# Patient Record
Sex: Female | Born: 1965 | Race: White | Hispanic: No | Smoking: Current every day smoker
Health system: Southern US, Community
[De-identification: ages and names within clinical notes are randomized; demographics above are authoritative.]

## PROBLEM LIST (undated history)

## (undated) DIAGNOSIS — F329 Major depressive disorder, single episode, unspecified: Secondary | ICD-10-CM

## (undated) DIAGNOSIS — F419 Anxiety disorder, unspecified: Secondary | ICD-10-CM

## (undated) DIAGNOSIS — M549 Dorsalgia, unspecified: Secondary | ICD-10-CM

## (undated) DIAGNOSIS — F32A Depression, unspecified: Secondary | ICD-10-CM

## (undated) HISTORY — DX: Dorsalgia, unspecified: M54.9

## (undated) HISTORY — DX: Major depressive disorder, single episode, unspecified: F32.9

## (undated) HISTORY — DX: Depression, unspecified: F32.A

## (undated) HISTORY — DX: Anxiety disorder, unspecified: F41.9

## (undated) HISTORY — PX: TUBAL LIGATION: SHX77

---

## 2002-09-13 ENCOUNTER — Emergency Department (HOSPITAL_COMMUNITY): Admission: EM | Admit: 2002-09-13 | Discharge: 2002-09-13 | Payer: Self-pay | Admitting: Emergency Medicine

## 2003-03-23 ENCOUNTER — Ambulatory Visit (HOSPITAL_COMMUNITY): Admission: RE | Admit: 2003-03-23 | Discharge: 2003-03-23 | Payer: Self-pay | Admitting: Obstetrics and Gynecology

## 2003-03-23 ENCOUNTER — Encounter (INDEPENDENT_AMBULATORY_CARE_PROVIDER_SITE_OTHER): Payer: Self-pay | Admitting: Specialist

## 2003-04-11 ENCOUNTER — Emergency Department (HOSPITAL_COMMUNITY): Admission: EM | Admit: 2003-04-11 | Discharge: 2003-04-12 | Payer: Self-pay | Admitting: Emergency Medicine

## 2003-04-12 ENCOUNTER — Encounter: Payer: Self-pay | Admitting: Emergency Medicine

## 2003-05-08 ENCOUNTER — Encounter: Payer: Self-pay | Admitting: Family Medicine

## 2003-05-08 ENCOUNTER — Encounter: Admission: RE | Admit: 2003-05-08 | Discharge: 2003-05-08 | Payer: Self-pay | Admitting: Family Medicine

## 2004-03-06 ENCOUNTER — Other Ambulatory Visit: Admission: RE | Admit: 2004-03-06 | Discharge: 2004-03-06 | Payer: Self-pay | Admitting: Family Medicine

## 2004-03-08 ENCOUNTER — Other Ambulatory Visit: Admission: RE | Admit: 2004-03-08 | Discharge: 2004-03-08 | Payer: Self-pay | Admitting: Family Medicine

## 2005-08-08 ENCOUNTER — Emergency Department (HOSPITAL_COMMUNITY): Admission: EM | Admit: 2005-08-08 | Discharge: 2005-08-08 | Payer: Self-pay | Admitting: Emergency Medicine

## 2005-08-26 ENCOUNTER — Emergency Department (HOSPITAL_COMMUNITY): Admission: EM | Admit: 2005-08-26 | Discharge: 2005-08-26 | Payer: Self-pay | Admitting: Emergency Medicine

## 2005-10-14 ENCOUNTER — Emergency Department (HOSPITAL_COMMUNITY): Admission: EM | Admit: 2005-10-14 | Discharge: 2005-10-14 | Payer: Self-pay | Admitting: Emergency Medicine

## 2006-12-11 ENCOUNTER — Emergency Department (HOSPITAL_COMMUNITY): Admission: EM | Admit: 2006-12-11 | Discharge: 2006-12-11 | Payer: Self-pay | Admitting: Emergency Medicine

## 2007-09-10 ENCOUNTER — Emergency Department (HOSPITAL_COMMUNITY): Admission: EM | Admit: 2007-09-10 | Discharge: 2007-09-10 | Payer: Self-pay | Admitting: Family Medicine

## 2008-08-10 ENCOUNTER — Encounter: Admission: RE | Admit: 2008-08-10 | Discharge: 2008-08-10 | Payer: Self-pay | Admitting: Family Medicine

## 2008-08-16 ENCOUNTER — Encounter
Admission: RE | Admit: 2008-08-16 | Discharge: 2008-08-17 | Payer: Self-pay | Admitting: Physical Medicine & Rehabilitation

## 2008-08-17 ENCOUNTER — Ambulatory Visit: Payer: Self-pay | Admitting: Physical Medicine & Rehabilitation

## 2009-05-31 ENCOUNTER — Emergency Department (HOSPITAL_COMMUNITY): Admission: EM | Admit: 2009-05-31 | Discharge: 2009-05-31 | Payer: Self-pay | Admitting: Emergency Medicine

## 2010-03-14 ENCOUNTER — Ambulatory Visit (HOSPITAL_COMMUNITY): Admission: RE | Admit: 2010-03-14 | Discharge: 2010-03-14 | Payer: Self-pay | Admitting: Obstetrics and Gynecology

## 2010-11-16 ENCOUNTER — Encounter: Payer: Self-pay | Admitting: Family Medicine

## 2010-11-17 ENCOUNTER — Encounter: Payer: Self-pay | Admitting: Family Medicine

## 2011-01-13 LAB — CBC
HCT: 35.3 % — ABNORMAL LOW (ref 36.0–46.0)
Hemoglobin: 11.9 g/dL — ABNORMAL LOW (ref 12.0–15.0)
MCHC: 33.8 g/dL (ref 30.0–36.0)
MCV: 85.7 fL (ref 78.0–100.0)
Platelets: 381 K/uL (ref 150–400)
RBC: 4.11 MIL/uL (ref 3.87–5.11)
RDW: 13.6 % (ref 11.5–15.5)
WBC: 8.1 K/uL (ref 4.0–10.5)

## 2011-01-13 LAB — HCG, SERUM, QUALITATIVE: Preg, Serum: NEGATIVE

## 2011-03-11 NOTE — Consult Note (Signed)
Consult requested by Dr Deniece Portela A. Sheffield Slider, MD   CHIEF COMPLAINT:  Back pain and posterior thigh pain.   Sydney Serrano is a 45 year old female with a 2-year history of low back  pain which radiates in the lower extremities mainly into the posterior  thigh area but occasionally down to the feet.  She states that her pain  averages at 8/10 level, described as sharp, constant, and aching.  Pain  interferes with activity at a 7/10 level.  She states that she had no  traumatic event immediately proceeding the onset of pain, although does  have a history of falling down on some ice a month or 2 prior to the  onset of her pain.  He has had no motor vehicle accident.  No Workers'  Compensation injury.   Pain is worse during the daytime and night hours, not too bad in the  morning.  Sleep is fair.  Pain is worse with bending, standing, and  improves with medication.   She has been on hydrocodone in the past and Dr. Jeannetta Nap prescribed it a  couple years for her per her report.  She did receive and fill a  prescription from a dentist and therefore, Dr. Jeannetta Nap did not fill  anymore prescriptions.   I did review the [facility] registry.  This does show Dr. Jeannetta Nap did  prescribe medications, starting December 14, 2006, both hydrocodone and  alprazolam.  She saw a PA at [facility] at  Dr. Spero Curb office, who prescribed some Lyrica.  The patient then had  some Tussionex cough syrup but really has had same providers.  The  dentist did prescribe medication including hydrocodone on June 07, 2007, but did receive some prescriptions from Dr. Jeannetta Nap after that as  well.   She denies any illicit drug abuse or taking any nondisclosed opiates at  this time.   SOCIAL HISTORY:  Divorced, lives with her son, smokes a pack per day.   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   REVIEW OF SYSTEMS:  Positive for depression, anxiety, has been on  anxiolytics as well  as antidepressant medications per Dr. Jeannetta Nap.  She  denies any suicidal thoughts.   Positive for fever or chills.  Positive for constipation, poor appetite.   FAMILY HISTORY:  Heart disease, diabetes, and high blood pressure.   PHYSICAL EXAMINATION:  VITAL SIGNS:  Blood pressure 143/80, pulse 76,  respiratory rate is 18, and O2 sat 100% on room air.  GENERAL:  Well-developed, well-nourished female in no acute distress.  Orientation x3.  NEURO:  Affect is alert.  Gait is normal.  She is able to toe-walk and  heel-walk.  She has normal coordination in upper and lower extremities.  She has no evidence of peripheral edema.  She has good peripheral  pulses.  Her deep tendon reflexes are 2-3+ biceps, triceps,  brachioradialis, and the ankle.  Sensation is normal in bilateral upper  and lower extremities.  Neck range of motion is normal.  Lumbar spine  range of motion is 75% forward flexion, 75% extension, end range  accompanied by pain.  She has some pain with left-sided bending.   Motor strength is 5/5 bilateral deltoid, biceps, triceps, grip, as well  as hip flexion, knee extension, and ankle dorsiflexion.  She has no  range of motion limitations or joint instability in the upper or lower  extremities.   REVIEW OF IMAGING STUDIES:  She has had  lumbar spine MRI performed at  [facility].  There is no evidence of lumbar stenosis.  Minimal  bulge at L4-L5. Tiny synovial cyst, right L4-L5.   IMPRESSION:  Chronic mainly axial low back pain but some lower extremity  radiating pattern, not clearly radicular.  Went over differential with  the patient including use of the spine model.  Given that at her pain  starts at around L4 area, I doubt that it is purely sacroiliac, consider  lumbar facet joints as pain generators.  Sacroiliitis is a little  further down the differential, also may be myofascial pain.   We will set her up with diagnostic facet injection.  We will give her   samples of Celebrex as an antiinflammatory.  Should continue these up to  the time of the injections if need be.   I checked a urine drug screen in the event we use narcotic analgesics as  part of her pain management program.  I did offer her tramadol until I  get urine drug screen results back, and she declines saying it does not  really help her nor does Darvocet.   In addition, she has some sleep disorder due to pain, some back spasms  that she notes.  We will start some Robaxin 500 mg nightly.   Thank you for this interesting consultation.  I will keep you apprised  of her situation.  Oswestry disability index score today is 32%, putting  her in the moderate range.      Erick Colace, M.D.  Electronically Signed     AEK/MedQ  D:08/17/2008 16:45:15  T:08/18/2008 03:05:40  Job #:  [id number]   cc:   Deniece Portela A. Sheffield Slider, M.D.   Buren Kos, M.D.  Fax: [phone]

## 2011-03-14 ENCOUNTER — Ambulatory Visit (INDEPENDENT_AMBULATORY_CARE_PROVIDER_SITE_OTHER): Payer: Self-pay | Admitting: Family Medicine

## 2011-03-14 ENCOUNTER — Encounter: Payer: Self-pay | Admitting: Family Medicine

## 2011-03-14 DIAGNOSIS — I10 Essential (primary) hypertension: Secondary | ICD-10-CM | POA: Insufficient documentation

## 2011-03-14 DIAGNOSIS — F418 Other specified anxiety disorders: Secondary | ICD-10-CM | POA: Insufficient documentation

## 2011-03-14 DIAGNOSIS — M545 Low back pain: Secondary | ICD-10-CM

## 2011-03-14 DIAGNOSIS — F341 Dysthymic disorder: Secondary | ICD-10-CM

## 2011-03-14 MED ORDER — PROPRANOLOL HCL 40 MG PO TABS: 40.0000 mg | ORAL_TABLET | Freq: Two times a day (BID) | ORAL | Status: AC

## 2011-03-14 MED ORDER — TRIAMCINOLONE ACETONIDE 0.1 % EX OINT: TOPICAL_OINTMENT | Freq: Three times a day (TID) | CUTANEOUS | Status: AC

## 2011-03-14 MED ORDER — HYDROCODONE-ACETAMINOPHEN 7.5-500 MG PO TABS: 1.0000 | ORAL_TABLET | ORAL | Status: AC | PRN

## 2011-03-14 MED ORDER — ALPRAZOLAM 1 MG PO TABS: 1.0000 mg | ORAL_TABLET | Freq: Three times a day (TID) | ORAL | Status: AC | PRN

## 2011-03-14 MED ORDER — AMITRIPTYLINE HCL 10 MG PO TABS: 10.0000 mg | ORAL_TABLET | Freq: Every day | ORAL | Status: AC

## 2011-03-14 NOTE — Op Note (Signed)
   NAME:  Sydney Serrano, Sydney Serrano                   ACCOUNT NO.:  [id number]   MEDICAL RECORD NO.:  [id number]                   PATIENT TYPE:  AMB   LOCATION:  DAY                                  FACILITY[facility]  [facility]   PHYSICIAN:  Leona Singleton, M.D.                  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   OPERATION/PROCEDURE:  Loupe electrical excision procedure.   SURGEON:  Leona Singleton, M.D.   ANESTHESIA:  General.   DESCRIPTION OF PROCEDURE:  The patient was prepped and draped in the usual  fashion for a vaginal procedure.  A speculum was placed in the vagina and  the cervix was stained with Lugol solution.  The dysplastic lesion was well  identified and was extensive to the peripheral area of the cervix, more  anteriorly than posteriorly.  Following,  a 2 cm loupe was used to excise  the large anterior posterior lesions.  Following, a 1.5 cm loupe was then  used to excise the central core of the cervix around the cervical os.  The  blood loss was minimal.  The bleeding was minimal and controlled by ball  cautery.  Monsel solution was applied and the patient was hemostatic at the  end of the procedure.  The patient tolerated the procedure well and was sent  to the recovery room in good condition.                                               Leona Singleton, M.D.    KB/MEDQ  D:  03/23/2003  T:  03/23/2003  Job:  [id number]   cc:   Lacretia Leigh. Quintella Reichert, M.D.  Mellisa[address] [place]  [place] [address]  Fax: [phone]

## 2011-03-14 NOTE — Assessment & Plan Note (Signed)
Patient says Sydney Serrano is not helping anymore. Discussed changing Sydney Serrano to an SNRI/SSRI at next visit. Takes Xanax 1mg  three times daily.  Discussed changing to longer acting benzo in the future.   Patient reluctant to change meds. Will need to discuss this further at a follow up visit.  Patient may benefit from [facility] clinic w/ Dr. Pascal Lux.

## 2011-03-14 NOTE — Progress Notes (Signed)
  Subjective:    Patient ID: Sydney Serrano, female    DOB: 1966-08-07, 45 y.o.   MRN: [id number]  HPI This is a 45 year old female who presents to clinic to meet new PCP and establish care.  Wants to discuss a rash on R lower extremity and current medications.  Rash started 2 months ago.  It is itchy.  Denies pain, burning, or numbness/tingling.  Has tried lotion and OTC Benadryl prn for itching, but rash has not resolved.  Second, patient does not want to change current medications.  She has tried multiple SSRIs and all have made her anxiety worse.  Wants to continue Xanax.  She has seen a psychiatrist in the past who prescribed Seroquel.  This helped patient's depression and anxiety, but cannot afford medication without insurance.  Denies fever, chills, NS, N/V, abdominal pain or GI distress.    Review of Systems Per HPI    Objective:   Physical Exam  Constitutional: No distress.       thin  Eyes: EOM are normal. Pupils are equal, round, and reactive to light.  Neck: Neck supple.  Cardiovascular: Normal rate and regular rhythm.  Exam reveals no gallop and no friction rub.   No murmur heard. Pulmonary/Chest: Effort normal and breath sounds normal. No respiratory distress. She has no wheezes. She has no rales.  Musculoskeletal: She exhibits no edema.  Lymphadenopathy:    She has no cervical adenopathy.  Neurological: She is alert.  Skin: Rash noted.       Erythematous, dry, papular rash on R lower extremity, about 6cm long and 2 cm wide  Psychiatric: She has a normal mood and affect.          Assessment & Plan:

## 2011-03-14 NOTE — Assessment & Plan Note (Signed)
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 Patient does not want to change medication. Will continue current regimen. Will need to order UDS and ask patient to sign pain contract at next visit.

## 2011-03-14 NOTE — Patient Instructions (Addendum)
It was nice to meet you today. You may apply Triamcinolone ointment to affected area 2-3 times/day as needed. Please call me if you need any refills or if you have any other medical concerns. You can schedule an appointment with me anytime to discuss anxiety/depression. Please continue to cut back on cigarettes.   Schedule an adult physical with me in 4-6 months. Thank you, Tye Savoy, DO

## 2011-03-14 NOTE — Assessment & Plan Note (Signed)
BP mildly elevated today @ 146/82. Will continue current regimen for now. May consider adding HCTZ to regimen if BP continues to remain elevated. No signs or complications of elevated BP today.

## 2014-12-26 ENCOUNTER — Ambulatory Visit (HOSPITAL_COMMUNITY): Payer: Self-pay | Admitting: Psychiatry

## 2015-02-08 ENCOUNTER — Encounter (HOSPITAL_COMMUNITY): Payer: Self-pay | Admitting: Psychiatry

## 2015-02-08 ENCOUNTER — Ambulatory Visit (INDEPENDENT_AMBULATORY_CARE_PROVIDER_SITE_OTHER): Payer: PRIVATE HEALTH INSURANCE | Admitting: Psychiatry

## 2015-02-08 VITALS — BP 131/83 | HR 80 | Ht 64.0 in | Wt 119.8 lb

## 2015-02-08 DIAGNOSIS — F332 Major depressive disorder, recurrent severe without psychotic features: Secondary | ICD-10-CM | POA: Diagnosis not present

## 2015-02-08 DIAGNOSIS — F4001 Agoraphobia with panic disorder: Secondary | ICD-10-CM | POA: Diagnosis not present

## 2015-02-08 DIAGNOSIS — F411 Generalized anxiety disorder: Secondary | ICD-10-CM | POA: Diagnosis not present

## 2015-02-08 DIAGNOSIS — Z79899 Other long term (current) drug therapy: Secondary | ICD-10-CM | POA: Diagnosis not present

## 2015-02-08 DIAGNOSIS — F17200 Nicotine dependence, unspecified, uncomplicated: Secondary | ICD-10-CM | POA: Insufficient documentation

## 2015-02-08 MED ORDER — QUETIAPINE FUMARATE 200 MG PO TABS: 200.0000 mg | ORAL_TABLET | Freq: Every day | ORAL | Status: AC

## 2015-02-08 MED ORDER — LITHIUM CARBONATE ER 300 MG PO TBCR: 300.0000 mg | EXTENDED_RELEASE_TABLET | Freq: Every day | ORAL | Status: AC
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
## 2015-02-08 NOTE — Patient Instructions (Signed)
1. Labs 2. EKG call [phone]  

## 2015-03-27 ENCOUNTER — Other Ambulatory Visit (HOSPITAL_COMMUNITY): Payer: Self-pay | Admitting: Psychiatry

## 2015-04-10 ENCOUNTER — Ambulatory Visit (HOSPITAL_COMMUNITY): Payer: Self-pay | Admitting: Psychiatry

## 2015-06-26 ENCOUNTER — Emergency Department (HOSPITAL_COMMUNITY)
Admission: EM | Admit: 2015-06-26 | Discharge: 2015-06-26 | Disposition: A | Discharge status: Home / Self Care | Payer: PRIVATE HEALTH INSURANCE | Attending: Emergency Medicine | Admitting: Emergency Medicine

## 2015-06-26 ENCOUNTER — Emergency Department (HOSPITAL_COMMUNITY): Payer: PRIVATE HEALTH INSURANCE

## 2015-06-26 ENCOUNTER — Encounter (HOSPITAL_COMMUNITY): Payer: Self-pay | Admitting: Emergency Medicine

## 2015-06-26 DIAGNOSIS — Z79899 Other long term (current) drug therapy: Secondary | ICD-10-CM | POA: Insufficient documentation

## 2015-06-26 DIAGNOSIS — F419 Anxiety disorder, unspecified: Secondary | ICD-10-CM | POA: Insufficient documentation

## 2015-06-26 DIAGNOSIS — R079 Chest pain, unspecified: Secondary | ICD-10-CM | POA: Insufficient documentation

## 2015-06-26 DIAGNOSIS — I10 Essential (primary) hypertension: Secondary | ICD-10-CM | POA: Insufficient documentation

## 2015-06-26 DIAGNOSIS — Z720 Tobacco use: Secondary | ICD-10-CM | POA: Insufficient documentation

## 2015-06-26 DIAGNOSIS — F329 Major depressive disorder, single episode, unspecified: Secondary | ICD-10-CM | POA: Insufficient documentation

## 2015-06-26 LAB — BASIC METABOLIC PANEL
Anion gap: 14 (ref 5–15)
BUN: 16 mg/dL (ref 6–20)
CHLORIDE: 108 mmol/L (ref 101–111)
CO2: 19 mmol/L — ABNORMAL LOW (ref 22–32)
CREATININE: 0.73 mg/dL (ref 0.44–1.00)
Calcium: 9.9 mg/dL (ref 8.9–10.3)
GFR calc Af Amer: >60 mL/min (ref >60)
GFR calc non Af Amer: >60 mL/min (ref >60)
GLUCOSE: 115 mg/dL — AB (ref 65–99)
POTASSIUM: 3.1 mmol/L — AB (ref 3.5–5.1)
Sodium: 141 mmol/L (ref 135–145)

## 2015-06-26 LAB — CBC
HEMATOCRIT: 40.4 % (ref 36.0–46.0)
Hemoglobin: 13.7 g/dL (ref 12.0–15.0)
MCH: 30.1 pg (ref 26.0–34.0)
MCHC: 33.9 g/dL (ref 30.0–36.0)
MCV: 88.8 fL (ref 78.0–100.0)
Platelets: 358 10*3/uL (ref 150–400)
RBC: 4.55 MIL/uL (ref 3.87–5.11)
RDW: 13.1 % (ref 11.5–15.5)
WBC: 13.2 10*3/uL — ABNORMAL HIGH (ref 4.0–10.5)

## 2015-06-26 LAB — I-STAT TROPONIN, ED
Troponin i, poc: 0 ng/mL (ref 0.00–0.08)
Troponin i, poc: 0 ng/mL (ref 0.00–0.08)

## 2015-06-26 MED ORDER — LORAZEPAM 1 MG PO TABS
0.5000 mg | ORAL_TABLET | Freq: Once | ORAL | Status: AC
Start: 2015-06-26 — End: 2015-06-26
  Administered 2015-06-26: 0.5 mg via ORAL
  Filled 2015-06-26: qty 1

## 2015-06-26 MED ORDER — ASPIRIN 81 MG PO CHEW
324.0000 mg | CHEWABLE_TABLET | Freq: Once | ORAL | Status: AC
  Administered 2015-06-26: 324 mg via ORAL
  Filled 2015-06-26: qty 4

## 2015-06-26 NOTE — ED Provider Notes (Signed)
CSN: [id number]     Arrival date & time 06/26/15  1229 History   First MD Initiated Contact with Patient 06/26/15 1725     Chief Complaint  Patient presents with  . Shortness of Breath  . Anxiety     (Consider location/radiation/quality/duration/timing/severity/associated sxs/prior Treatment) Patient is a 49 y.o. female presenting with shortness of breath and anxiety. The history is provided by the patient. No language interpreter was used.  Shortness of Breath Anxiety  Miss Kley is a 49 year old female with a history of anxiety, depression, hypertension, and back pain who presents for shortness of breath and chest heaviness that began 2 days ago. It is located in the center of her chest and she denies any radiation of the pain. She states that she used to take Xanax for this and has had chest pain often in the past but was told that it was due to anxiety. She states she wants to go home and does not want to be here and that her son made her come. Her son states that he called her panicking about chest pain and shortness of breath and recommended that she come to the hospital. She denies any estrogen use. She is a pack-a-day smoker. She denies any cardiac history. Her father died at the age of 53 from an MI. She states she has problems at home and is currently looking after her mother with dementia which is not easy. She denies any diaphoresis, abdominal pain, nausea, vomiting, diarrhea, leg swelling.  Past Medical History  Diagnosis Date  . Anxiety   . Depression   . Essential hypertension 03/14/2011  . Back pain    Past Surgical History  Procedure Laterality Date  . Tubal ligation     Family History  Problem Relation Age of Onset  . Cancer Maternal Grandmother   . Dementia Mother   . Bipolar disorder Neg Hx   . Depression Neg Hx   . Drug abuse Neg Hx   . Alcohol abuse Father    Social History  Substance Use Topics  . Smoking status: Current Every Day Smoker -- 0.50  packs/day for 15 years    Types: Cigarettes  . Smokeless tobacco: Never Used  . Alcohol Use: No   OB History    No data available     Review of Systems  Respiratory: Positive for shortness of breath.   All other systems reviewed and are negative.     Allergies  Review of patient's allergies indicates no known allergies.  Home Medications   Prior to Admission medications   Medication Sig Start Date End Date Taking? Authorizing Provider  cyclobenzaprine (FLEXERIL) 10 MG tablet Take 10 mg by mouth 3 (three) times daily as needed for muscle spasms.    Historical Provider, MD  dextromethorphan-guaiFENesin (MUCINEX DM) 30-600 MG per 12 hr tablet Take 1 tablet by mouth 2 (two) times daily as needed for cough.    Historical Provider, MD  diphenhydrAMINE (BENADRYL) 25 MG tablet Take 25 mg by mouth every 8 (eight) hours as needed for allergies.    Historical Provider, MD  hydrochlorothiazide (HYDRODIURIL) 25 MG tablet Take 25 mg by mouth daily.    Historical Provider, MD  HYDROcodone-acetaminophen (NORCO) 10-325 MG per tablet Take 1 tablet by mouth every 4 (four) hours as needed.    Historical Provider, MD  lithium carbonate (LITHOBID) 300 MG CR tablet Take 1 tablet (300 mg total) by mouth daily. 02/08/15 02/08/16  Oletta Darter, MD  loratadine (CLARITIN) 10 MG  tablet Take 10 mg by mouth daily.    Historical Provider, MD  Multiple Vitamins-Minerals (MULTIVITAMIN WITH MINERALS) tablet Take 1 tablet by mouth daily.    Historical Provider, MD  QUEtiapine (SEROQUEL) 200 MG tablet Take 1 tablet (200 mg total) by mouth at bedtime. 02/08/15   Oletta Darter, MD   BP 146/79 mmHg  Pulse 98  Temp(Src) 98.4 F (36.9 C) (Oral)  Resp 19  Ht  (1.626 m)  Wt 119 lb (53.978 kg)  BMI 20.42 kg/m2  SpO2 95% Physical Exam  Constitutional: She is oriented to person, place, and time. She appears well-developed and well-nourished.  Appears anxious and talking very fast.  HENT:  Head: Normocephalic and  atraumatic.  Eyes: Conjunctivae are normal.  Neck: Normal range of motion. Neck supple.  Cardiovascular: Normal rate, regular rhythm and normal heart sounds.   Pulmonary/Chest: Effort normal and breath sounds normal. No accessory muscle usage. No respiratory distress. She has no decreased breath sounds. She has no wheezes.  Lungs clear to auscultation bilaterally. No palpable chest tenderness.  Abdominal: Soft. There is no tenderness.  Musculoskeletal: Normal range of motion. She exhibits no edema.  No leg swelling or calf tenderness.  Neurological: She is alert and oriented to person, place, and time.  Skin: Skin is warm and dry.  Psychiatric: She has a normal mood and affect. Her behavior is normal.  Nursing note and vitals reviewed.   ED Course  Procedures (including critical care time) Labs Review Labs Reviewed  BASIC METABOLIC PANEL - Abnormal; Notable for the following:    Potassium 3.1 (*)    CO2 19 (*)    Glucose, Bld 115 (*)    All other components within normal limits  CBC - Abnormal; Notable for the following:    WBC 13.2 (*)    All other components within normal limits  I-STAT TROPOININ, ED  Rosezena Sensor, ED    Imaging Review Dg Chest 2 View  06/26/2015   CLINICAL DATA:  Shortness of breath and chest pain.  EXAM: CHEST  2 VIEW  COMPARISON:  None.  FINDINGS: Normal heart size and mediastinal contours. Hyperinflation. EKG leads over the bilateral upper chest. No acute infiltrate or edema. No effusion or pneumothorax. No acute osseous findings.  IMPRESSION: Hyperinflation without acute abnormality.   Electronically Signed   By: Marnee Spring M.D.   On: 06/26/2015 14:06   I have personally reviewed and evaluated these images and lab results as part of my medical decision-making.   EKG Interpretation   Date/Time:  Tuesday June 26 2015 12:36:50 EDT Ventricular Rate:  122 PR Interval:  126 QRS Duration: 78 QT Interval:  312 QTC Calculation: 444 R Axis:    82 Text Interpretation:  Sinus tachycardia with Premature atrial complexes  Right atrial enlargement Borderline ECG ED PHYSICIAN INTERPRETATION  AVAILABLE IN [facility] Confirmed by TEST, Record ([id number]) on  06/27/2015 7:00:39 AM      MDM   Final diagnoses:  Chest pain, unspecified chest pain type  Patient presents for chest pain and shortness of breath that began 2 days ago. She denies any treatment prior to arrival. Her vitals are stable and she is well-appearing. Her labs are not concerning and troponin is negative.  CXR is negative for pneumothorax, edema, or pneumonia. Her EKG shows tachycardia but otherwise is not concerning. Heart score is 2.  She was very anxious on arrival but her heart rate has improved after calming down.  My suspicion of PE is  very low.  Repeat troponin is negative also. I discussed strict return precautions with the patient and son and they verbally agree with the plan.      Catha Gosselin, PA-C 06/28/15 1610  Derwood Kaplan, MD 06/29/15 1715

## 2015-06-26 NOTE — Discharge Instructions (Signed)
Chest Pain (Nonspecific) Follow up with a primary care provider using the resource guide below. Return for chest pain or shortness of breath. It is often hard to give a specific diagnosis for the cause of chest pain. There is always a chance that your pain could be related to something serious, such as a heart attack or a blood clot in the lungs. You need to follow up with your health care provider for further evaluation. CAUSES   Heartburn.  Pneumonia or bronchitis.  Anxiety or stress.  Inflammation around your heart (pericarditis) or lung (pleuritis or pleurisy).  A blood clot in the lung.  A collapsed lung (pneumothorax). It can develop suddenly on its own (spontaneous pneumothorax) or from trauma to the chest.  Shingles infection (herpes zoster virus). The chest wall is composed of bones, muscles, and cartilage. Any of these can be the source of the pain.  The bones can be bruised by injury.  The muscles or cartilage can be strained by coughing or overwork.  The cartilage can be affected by inflammation and become sore (costochondritis). DIAGNOSIS  Lab tests or other studies may be needed to find the cause of your pain. Your health care provider may have you take a test called an ambulatory electrocardiogram (ECG). An ECG records your heartbeat patterns over a 24-hour period. You may also have other tests, such as:  Transthoracic echocardiogram (TTE). During echocardiography, sound waves are used to evaluate how blood flows through your heart.  Transesophageal echocardiogram (TEE).  Cardiac monitoring. This allows your health care provider to monitor your heart rate and rhythm in real time.  Holter monitor. This is a portable device that records your heartbeat and can help diagnose heart arrhythmias. It allows your health care provider to track your heart activity for several days, if needed.  Stress tests by exercise or by giving medicine that makes the heart beat  faster. TREATMENT   Treatment depends on what may be causing your chest pain. Treatment may include:  Acid blockers for heartburn.  Anti-inflammatory medicine.  Pain medicine for inflammatory conditions.  Antibiotics if an infection is present.  You may be advised to change lifestyle habits. This includes stopping smoking and avoiding alcohol, caffeine, and chocolate.  You may be advised to keep your head raised (elevated) when sleeping. This reduces the chance of acid going backward from your stomach into your esophagus. Most of the time, nonspecific chest pain will improve within 2-3 days with rest and mild pain medicine.  HOME CARE INSTRUCTIONS   If antibiotics were prescribed, take them as directed. Finish them even if you start to feel better.  For the next few days, avoid physical activities that bring on chest pain. Continue physical activities as directed.  Do not use any tobacco products, including cigarettes, chewing tobacco, or electronic cigarettes.  Avoid drinking alcohol.  Only take medicine as directed by your health care provider.  Follow your health care provider's suggestions for further testing if your chest pain does not go away.  Keep any follow-up appointments you made. If you do not go to an appointment, you could develop lasting (chronic) problems with pain. If there is any problem keeping an appointment, call to reschedule. SEEK MEDICAL CARE IF:   Your chest pain does not go away, even after treatment.  You have a rash with blisters on your chest.  You have a fever. SEEK IMMEDIATE MEDICAL CARE IF:   You have increased chest pain or pain that spreads to your arm,  neck, jaw, back, or abdomen.  You have shortness of breath.  You have an increasing cough, or you cough up blood.  You have severe back or abdominal pain.  You feel nauseous or vomit.  You have severe weakness.  You faint.  You have chills. This is an emergency. Do not wait to  see if the pain will go away. Get medical help at once. Call your local emergency services (911 in U.S.). Do not drive yourself to the hospital. MAKE SURE YOU:   Understand these instructions.  Will watch your condition.  Will get help right away if you are not doing well or get worse. Document Released: 07/23/2005 Document Revised: 10/18/2013 Document Reviewed: 05/18/2008 [facility] Patient Information 2015 [place], [place]. This information is not intended to replace advice given to you by your health care provider. Make sure you discuss any questions you have with your health care provider.  Emergency Department Resource Guide 1) Find a Doctor and Pay Out of Pocket Although you won't have to find out who is covered by your insurance plan, it is a good idea to ask around and get recommendations. You will then need to call the office and see if the doctor you have chosen will accept you as a new patient and what types of options they offer for patients who are self-pay. Some doctors offer discounts or will set up payment plans for their patients who do not have insurance, but you will need to ask so you aren't surprised when you get to your appointment.  2) Contact Your Local Health Department Not all health departments have doctors that can see patients for sick visits, but many do, so it is worth a call to see if yours does. If you don't know where your local health department is, you can check in your phone book. The CDC also has a tool to help you locate your state's health department, and many state websites also have listings of all of their local health departments.  3) Find a Walk-in Clinic If your illness is not likely to be very severe or complicated, you may want to try a walk in clinic. These are popping up all over the country in pharmacies, drugstores, and shopping centers. They're usually staffed by nurse practitioners or physician assistants that have been trained to treat common  illnesses and complaints. They're usually fairly quick and inexpensive. However, if you have serious medical issues or chronic medical problems, these are probably not your best option.  No Primary Care Doctor: - Call Health Connect at  [phone] - they can help you locate a primary care doctor that  accepts your insurance, provides certain services, etc. - Physician Referral Service- [phone]  Chronic Pain Problems: Organization         Address  Phone   Notes  [facility]  [phone] Patients need to be referred by their primary care doctor.   Medication Assistance: Organization         Address  Phone   Notes  [facility] Medication [facility] [address] [place], [place] [address] [phone] --Must be a resident of [facility] -- Must have NO insurance coverage whatsoever (no Medicaid/ Medicare, etc.) -- The pt. MUST have a primary care doctor that directs their care regularly and follows them in the community   [facility]  [phone]   [employer]  [phone]    Agencies that provide inexpensive medical care: Organization  Address  Phone   Notes  [facility]  [phone]   [facility]    [phone]) [phone]   [facility] [address] [place], [place] [address] [phone]) [phone]   [facility] [address], [place] [phone]) [phone]   [facility]anned Parenthood    [phone](336) [phone]   [facility]    [phone](336) [phone]   [facility] and [facility]  [address], [place] Phone:  [phone](336) [phone], Fax:  [phone] Hours of Operation:  9 am - 6 pm, M-F.  Also accepts Medicaid/Medicare and self-pay.  [facility]  [address], [place] Phone: [phone](336) [phone], Fax: [phone]. Hours of Operation:  8:30 am - 5:30 pm, M-F.  Also accepts Medicaid and self-pay.  [facility] [place]  [address], [place] Phone: [phone](336) [phone]   [facility] [address], [place], [place] [phone], Ext. [phone] Mondays & Thursdays: 7-9 AM.  First 15 patients are seen on a first come, first serve basis.    Medicaid-accepting [facility] Providers:  Organization         Address  Phone   Notes  [facility] [address], [place] [phone] Also accepts self-pay patients.  [facility] [address], [place]  [phone]) [phone]   [facility] [address], [place] [phone]) [phone]   [facility] [address] [place] [address], [place] [phone]) [phone]   Renaye RakersVeita Bland [address], [place]   [phone] Only accepts [place] Access [place] patients after they have their name applied to their card.   Self-Pay (no insurance) in [facility]:  Organization         Address  Phone   Notes  Sickle Cell Patients, [facility] [address], [place] [phone]) [phone]   [facility] [address], [place] [phone]) [phone]   [facility] [place]  [address], [place] [phone]) [phone]   [facility]/Dr. Osei-Bonsu  [address] [place] [address], [place] or [phone], [place] [phone]) [phone] Phone number for both [place] and [place] locations is the same.  [facility] and [facility] [address], [place] [phone]   [facility] [place] [address] [place] [address], [place] or [address] [phone](336) [phone]   [facility] [address], [place] [phone](336) [phone], phone; [phone]) [phone], fax Sees patients 1st and 3rd Saturday of every month.  Must not qualify for public or private insurance (i.e. Medicaid, Medicare, [place] Health Choice, Veterans' Benefits)  Household income should be no more than 200% of the  poverty level The clinic cannot treat you if you are pregnant or think you are pregnant  Sexually transmitted diseases are not treated at the clinic.    Dental Care: Organization         Address  Phone  Notes  [facility] of [facility] [address], [place] [phone]) [phone] Accepts children up to age 48 who are enrolled in [place] or [place] Health Choice; pregnant women with a Medicaid card; and children who have applied for Medicaid or [place] Health Choice, but were declined, whose parents can pay a reduced fee at time  of service.  [facility] of [facility]  [address], [place] [phone] Accepts children up to age 75 who are enrolled in [place] or [place] Health Choice; pregnant women with a Medicaid card; and children who have applied for Medicaid or [place] Health Choice, but were declined, whose parents can pay a reduced fee at time of service.  [facility]  [address] [place], [place] [phone] Patients are seen by appointment only. Walk-ins are not accepted. [facility] will see patients 74 years of age and older. Monday - Tuesday (8am-5pm) Most Wednesdays (8:30-5pm) $30 per visit, cash only  [facility] Adult Dental Access PROGRAM  [address], [facility] [phone] Patients are seen by appointment only. Walk-ins are not accepted. [facility] will see patients 72 years of age and older. One Wednesday Evening (Monthly: Volunteer Based).  $30 per visit, cash only  [employer]  [phone] for adults; Children under age 51, call Graduate Pediatric Dentistry at [phone]. Children aged 105-14, please call [phone] to request a pediatric application.  Dental services are provided in all areas of dental care including fillings, crowns and bridges, complete and partial dentures, implants, gum treatment, root canals, and extractions.  Preventive care is also provided. Treatment is provided to both adults and children. Patients are selected via a lottery and there is often a waiting list.   [facility] [address], [place]  [phone] [email or web address]   [facility] [address] [place], [place] [phone], Ext. [phone] Second and Fourth Thursday of each month, opens at 6:30 AM; Clinic ends at 9 AM.  Patients are seen on a first-come first-served basis, and a limited number are seen during each clinic.   [facility]  [address] [place], [place] [phone]   Eligibility Requirements You must have lived in [place], [place], or [place] counties for at least the last three months.   You cannot be eligible for state or federal sponsored [place], including [employer], [place], or [employer].   You generally cannot be eligible for healthcare insurance through your employer.    How to apply: Eligibility screenings are held every Tuesday and Wednesday afternoon from 1:00 pm until 4:00 pm. You do not need an appointment for the interview!  [facility] [address] [place], [place] [phone]   [facility] Health Department  [phone]   [facility] Health Department  [phone]   [facility] Health Department  [phone]    Behavioral Health Resources in the Community: Intensive Outpatient Programs Organization         Address  Phone  Notes  [facility] [address] [place], [place] [phone]   [facility] [address], [place], [place] [phone]   [facility] [address] [place], [place]  [phone]   [facility] [address]  [place], [place] [phone] or [phone]   Substance Abuse Resources Organization         Address  Phone  Notes  Alcohol and Drug Services  [phone]   Addiction  [facility]  [phone]   [facility] [place]  [phone]   Floydene Flock  [phone]   Residential & Outpatient Substance Abuse Program  [phone]   Psychological Services Organization         Address  Phone  Notes  [employer] [facility]  [phone]   [facility] Services  [phone]   [facility] Mental Health [address], [place] [phone] or [phone]    Mobile Crisis Teams Organization         Address  Phone  Notes  [facility], Mobile Crisis Care Unit  [phone]   [facility]  [address] [place], [place] [phone]   Doristine Locks [address] [place] [phone]    Self-Help/Support Groups Organization         Address  Phone             Notes  [facility] [place] - variety of support groups  [phone]- [phone] Call for more information  Narcotics Anonymous (NA), [facility] [address], [employer] [place]  2 meetings at this location   Statistician         Address  Phone  Notes  [facility] [address],    [place]  [phone]   [facility]  [address] [place] [address], [place], [place] [phone]   [facility] [address] [place], [place] [phone] Admissions: 8am-3pm M-F  [facility] [address],    [place], [place] [phone]   [facility] [address] [place], [place], [place] [phone]   [facility] [address]  [place], [place] [phone]   [facility] - Intensive Outpatient [address], [place], [place] [phone]   [facility] [address] [place].,  [place], [place] [phone] or [phone]   [facility] ([facility]) [address] [place], [place] [phone] Accepts Medicaid  [facility] [address],  [place]  [phone] Substance Abuse/Addiction Treatment   [facility] Organization         Address  Phone  Notes  [facility]  [phone]   Angie Fava, PhD [address] [place], [place]   [phone] or [phone]   [facility] Behavioral   [address] [place], [place] [phone]   [facility] [address] [place], [place] [phone] Insurance/Medicaid/sponsorship through [facility] and Families [address]                                    [place], [place] [phone] Therapy/tele-psych/case  [facility] [address], [place] [phone]    Dr. Lolly Mustache  [phone]   [facility]  [employer] [facility] Dept. 1) [address] [place] 2) [address], [place] 3)  [address], [place] [phone]  [phone]   [facility] Child Abuse Hotline 640-261-5297 or [phone] (After Hours)

## 2015-06-26 NOTE — ED Notes (Signed)
Discharge instructions reviewed with patient. Understanding verbalized. MD aware of remaining chest pain heaviness. Patient refused wheelchair at time of discharge. No acute distress noted.

## 2015-06-26 NOTE — ED Notes (Signed)
Pt c/o SOB and chest pressure from anxiety; pt sts out of meds for anxiety

## 2015-08-21 ENCOUNTER — Ambulatory Visit (HOSPITAL_COMMUNITY): Payer: Self-pay | Admitting: Psychiatry

## 2016-01-13 ENCOUNTER — Emergency Department (HOSPITAL_COMMUNITY)
Admission: EM | Admit: 2016-01-13 | Discharge: 2016-01-13 | Disposition: A | Discharge status: Home / Self Care | Payer: PRIVATE HEALTH INSURANCE | Attending: Emergency Medicine | Admitting: Emergency Medicine

## 2016-01-13 ENCOUNTER — Emergency Department (HOSPITAL_COMMUNITY): Payer: PRIVATE HEALTH INSURANCE

## 2016-01-13 ENCOUNTER — Encounter (HOSPITAL_COMMUNITY): Payer: Self-pay | Admitting: *Deleted

## 2016-01-13 DIAGNOSIS — W01198A Fall on same level from slipping, tripping and stumbling with subsequent striking against other object, initial encounter: Secondary | ICD-10-CM | POA: Diagnosis not present

## 2016-01-13 DIAGNOSIS — Y9289 Other specified places as the place of occurrence of the external cause: Secondary | ICD-10-CM | POA: Diagnosis not present

## 2016-01-13 DIAGNOSIS — I10 Essential (primary) hypertension: Secondary | ICD-10-CM | POA: Insufficient documentation

## 2016-01-13 DIAGNOSIS — F17210 Nicotine dependence, cigarettes, uncomplicated: Secondary | ICD-10-CM | POA: Diagnosis not present

## 2016-01-13 DIAGNOSIS — S20211A Contusion of right front wall of thorax, initial encounter: Secondary | ICD-10-CM | POA: Diagnosis not present

## 2016-01-13 DIAGNOSIS — Z3202 Encounter for pregnancy test, result negative: Secondary | ICD-10-CM | POA: Diagnosis not present

## 2016-01-13 DIAGNOSIS — R55 Syncope and collapse: Secondary | ICD-10-CM | POA: Diagnosis not present

## 2016-01-13 DIAGNOSIS — F329 Major depressive disorder, single episode, unspecified: Secondary | ICD-10-CM | POA: Insufficient documentation

## 2016-01-13 DIAGNOSIS — Z79899 Other long term (current) drug therapy: Secondary | ICD-10-CM | POA: Diagnosis not present

## 2016-01-13 DIAGNOSIS — S299XXA Unspecified injury of thorax, initial encounter: Secondary | ICD-10-CM | POA: Diagnosis present

## 2016-01-13 DIAGNOSIS — Y9389 Activity, other specified: Secondary | ICD-10-CM | POA: Diagnosis not present

## 2016-01-13 DIAGNOSIS — F419 Anxiety disorder, unspecified: Secondary | ICD-10-CM | POA: Diagnosis not present

## 2016-01-13 DIAGNOSIS — Y999 Unspecified external cause status: Secondary | ICD-10-CM | POA: Diagnosis not present

## 2016-01-13 LAB — CBC WITH DIFFERENTIAL/PLATELET
BASOS PCT: 0 %
Basophils Absolute: 0 10*3/uL (ref 0.0–0.1)
EOS ABS: 0 10*3/uL (ref 0.0–0.7)
Eosinophils Relative: 1 %
HCT: 38.4 % (ref 36.0–46.0)
HEMOGLOBIN: 12.3 g/dL (ref 12.0–15.0)
Lymphocytes Relative: 21 %
Lymphs Abs: 1.8 10*3/uL (ref 0.7–4.0)
MCH: 28.5 pg (ref 26.0–34.0)
MCHC: 32 g/dL (ref 30.0–36.0)
MCV: 89.1 fL (ref 78.0–100.0)
MONOS PCT: 6 %
Monocytes Absolute: 0.5 10*3/uL (ref 0.1–1.0)
NEUTROS PCT: 72 %
Neutro Abs: 6.1 10*3/uL (ref 1.7–7.7)
PLATELETS: 223 10*3/uL (ref 150–400)
RBC: 4.31 MIL/uL (ref 3.87–5.11)
RDW: 12.7 % (ref 11.5–15.5)
WBC: 8.4 10*3/uL (ref 4.0–10.5)

## 2016-01-13 LAB — BASIC METABOLIC PANEL
Anion gap: 11 (ref 5–15)
BUN: 26 mg/dL — ABNORMAL HIGH (ref 6–20)
CALCIUM: 9.5 mg/dL (ref 8.9–10.3)
CO2: 27 mmol/L (ref 22–32)
CREATININE: 0.64 mg/dL (ref 0.44–1.00)
Chloride: 103 mmol/L (ref 101–111)
Glucose, Bld: 94 mg/dL (ref 65–99)
Potassium: 3.5 mmol/L (ref 3.5–5.1)
SODIUM: 141 mmol/L (ref 135–145)

## 2016-01-13 LAB — I-STAT BETA HCG BLOOD, ED (MC, WL, AP ONLY)

## 2016-01-13 LAB — I-STAT TROPONIN, ED: TROPONIN I, POC: 0 ng/mL (ref 0.00–0.08)

## 2016-01-13 LAB — TROPONIN I

## 2016-01-13 MED ORDER — OXYCODONE-ACETAMINOPHEN 5-325 MG PO TABS
1.0000 | ORAL_TABLET | Freq: Once | ORAL | Status: AC
  Administered 2016-01-13: 1 via ORAL
  Filled 2016-01-13: qty 1

## 2016-01-13 MED ORDER — ONDANSETRON 4 MG PO TBDP
4.0000 mg | ORAL_TABLET | Freq: Once | ORAL | Status: AC
  Administered 2016-01-13: 4 mg via ORAL
  Filled 2016-01-13: qty 1

## 2016-01-13 MED ORDER — METHOCARBAMOL 500 MG PO TABS: 500.0000 mg | ORAL_TABLET | Freq: Two times a day (BID) | ORAL | Status: DC

## 2016-01-13 MED ORDER — NAPROXEN 500 MG PO TABS: 500.0000 mg | ORAL_TABLET | Freq: Two times a day (BID) | ORAL | Status: DC

## 2016-01-13 MED ORDER — HYDROCODONE-ACETAMINOPHEN 5-325 MG PO TABS
1.0000 | ORAL_TABLET | ORAL | Status: DC | PRN
Start: 2016-01-13 — End: 2017-09-01

## 2016-01-13 NOTE — ED Notes (Signed)
Declined W/C at D/C and was escorted to lobby by RN. 

## 2016-01-13 NOTE — ED Notes (Signed)
Pt fell against the bumper of a truck 2 weeks ago.

## 2016-01-13 NOTE — ED Notes (Signed)
Pt reports falling last night due to back pain and hit head and feels dizzy. Pt unsure of LOC.

## 2016-01-13 NOTE — ED Provider Notes (Signed)
CSN: [id number]     Arrival date & time 01/13/16  [id number] History  By signing my name below, I, Soijett Blue, attest that this documentation has been prepared under the direction and in the presence of Rodriques Badie Y. Zharia Conrow, PA-C Electronically Signed: Soijett Blue, ED Scribe. 01/13/2016. 9:35 AM.   Chief Complaint  Patient presents with  . Back Pain      The history is provided by the patient. No language interpreter was used.    HPI Comments: Sydney Serrano is a 50 y.o. female with a medical hx of back pain who presents to the Emergency Department complaining of constant, right sided back pain onset 2 weeks ago. Pt notes that she tripped and fell onto her right side against the bumper of a truck with her back pain occurring immediately afterwards. Pt states "I guess I fell last night from my back pain hurting so bad" pt last remembers her spouse asking if she was okay and him placing her on the couch. Pt notes that she was cooking dinner last night when the incident occurred. Pt spouse notes that he found her sitting down on the ground and the pt didn't pass out, but she was "out of it." Pt spouse placed her on the couch with the pt noting that she felt back at her baseline at that time. She states that she has not tried any medications for the relief for her symptoms. Pt denies vomiting, diaphoresis, CP, difficulty breathing, abdominal pain, color change, rash, wound, and any other symptoms.     Past Medical History  Diagnosis Date  . Anxiety   . Depression   . Essential hypertension 03/14/2011  . Back pain    Past Surgical History  Procedure Laterality Date  . Tubal ligation     Family History  Problem Relation Age of Onset  . Cancer Maternal Grandmother   . Dementia Mother   . Bipolar disorder Neg Hx   . Depression Neg Hx   . Drug abuse Neg Hx   . Alcohol abuse Father    Social History  Substance Use Topics  . Smoking status: Current Every Day Smoker -- 0.50 packs/day for 15  years    Types: Cigarettes  . Smokeless tobacco: Never Used  . Alcohol Use: No   OB History    No data available     Review of Systems  Respiratory: Negative for shortness of breath.   Cardiovascular: Negative for chest pain.  Gastrointestinal: Negative for vomiting.       No bowel incontinence  Genitourinary:       No bladder incontinence  Musculoskeletal: Positive for back pain. Negative for gait problem.  Skin: Negative for color change, rash and wound.  All other systems reviewed and are negative.     Allergies  Review of patient's allergies indicates no known allergies.  Home Medications   Prior to Admission medications   Medication Sig Start Date End Date Taking? Authorizing Provider  cyclobenzaprine (FLEXERIL) 10 MG tablet Take 10 mg by mouth 3 (three) times daily as needed for muscle spasms.    Historical Provider, MD  dextromethorphan-guaiFENesin (MUCINEX DM) 30-600 MG per 12 hr tablet Take 1 tablet by mouth 2 (two) times daily as needed for cough.    Historical Provider, MD  diphenhydrAMINE (BENADRYL) 25 MG tablet Take 25 mg by mouth every 8 (eight) hours as needed for allergies.    Historical Provider, MD  hydrochlorothiazide (HYDRODIURIL) 25 MG tablet Take 25 mg by mouth  daily.    Historical Provider, MD  HYDROcodone-acetaminophen (NORCO) 10-325 MG per tablet Take 1 tablet by mouth every 4 (four) hours as needed.    Historical Provider, MD  lithium carbonate (LITHOBID) 300 MG CR tablet Take 1 tablet (300 mg total) by mouth daily. 02/08/15 02/08/16  Oletta Darter, MD  loratadine (CLARITIN) 10 MG tablet Take 10 mg by mouth daily.    Historical Provider, MD  Multiple Vitamins-Minerals (MULTIVITAMIN WITH MINERALS) tablet Take 1 tablet by mouth daily.    Historical Provider, MD  QUEtiapine (SEROQUEL) 200 MG tablet Take 1 tablet (200 mg total) by mouth at bedtime. 02/08/15   Oletta Darter, MD   BP 123/74 mmHg  Temp(Src) 98.4 F (36.9 C) (Oral)  Resp 16  Ht   (1.626 m)  Wt 112 lb (50.803 kg)  BMI 19.22 kg/m2  SpO2 97% Physical Exam  Constitutional: She is oriented to person, place, and time. She appears well-developed and well-nourished. No distress.  Appears uncomfortable and tearful.   HENT:  Head: Normocephalic and atraumatic.  Eyes: EOM are normal.  Neck: Neck supple.  Cardiovascular: Normal rate.   Pulmonary/Chest: Effort normal. No respiratory distress.  Abdominal: Soft. There is no tenderness.  Musculoskeletal: Normal range of motion.       Back:  Diffuse right lateral thoracic TTP. No ecchymosis .no C-spine, T-spine, or L-spine tenderness.  Neurological: She is alert and oriented to person, place, and time. She has normal strength.  5/5 strength bilaterally in upper extremities.   Skin: Skin is warm and dry.  Psychiatric: She has a normal mood and affect. Her behavior is normal.  Nursing note and vitals reviewed.   ED Course  Procedures (including critical care time) DIAGNOSTIC STUDIES: Oxygen Saturation is 97% on RA, nl by my interpretation.    COORDINATION OF CARE: 9:35 AM Discussed treatment plan with pt at bedside which includes percocet, zofran, ribs unilateral with chest right xray, EKG, labs, and pt agreed to plan.    Labs Review Labs Reviewed  BASIC METABOLIC PANEL - Abnormal; Notable for the following:    BUN 26 (*)    All other components within normal limits  CBC WITH DIFFERENTIAL/PLATELET  TROPONIN I  I-STAT BETA HCG BLOOD, ED (MC, [facility], AP ONLY)  I-STAT TROPOININ, ED    Imaging Review Dg Ribs Unilateral W/chest Right  01/13/2016  CLINICAL DATA:  Pain following fall 10 days prior EXAM: RIGHT RIBS AND CHEST - 3+ VIEW COMPARISON:  Chest radiograph June 26, 2015 FINDINGS: Frontal chest as well as oblique and cone-down lower rib images were obtained. Lungs are clear. Heart size and pulmonary vascularity are normal. No adenopathy. There is no pneumothorax or effusion. There is no rib fracture evident.  IMPRESSION: No demonstrable rib fracture.  No edema or consolidation. Electronically Signed   By: Bretta Bang III M.D.   On: 01/13/2016 10:56   I have personally reviewed and evaluated these images and lab results as part of my medical decision-making.   EKG Interpretation   Date/Time:  Sunday January 13 2016 09:55:16 EDT Ventricular Rate:  73 PR Interval:  154 QRS Duration: 90 QT Interval:  398 QTC Calculation: 438 R Axis:   77 Text Interpretation:  Normal sinus rhythm T wave abnormality, consider  anterior ischemia Abnormal ECG T wave inversions similar to 2008 Confirmed  by Manus Gunning  MD, STEPHEN [id number]) on 01/13/2016 9:58:37 AM      MDM   Final diagnoses:  Contusion of right chest wall, initial encounter  Neurocardiogenic pre-syncope    Pt is an 50 y.o. female presenting with right rib/lateral chest pain and tenderness after a mechanical fall two weeks ago. We will obtain x-ray to r/o pulm or rib abnormality. Pt also reports what is likely a pain-induced vasovagal pre-syncopal episode last night. Was standing, walking to the kitchen and was found "out of it" sitting on the floor by her spouse. Pt states she remembers most of the event, remembers the pain being significant at that time. Denies chest pain or SOB then or now. Denies diaphoresis. Pt does not want any workup for her syncopal episode at this time. She states she is just here for the injury, is not worried about what happened last night. I discussed with her at length that we don't want to miss any emergent cause for her syncopal episode but she is still hesitant. I told pt that I can talk with my attending, Dr. Manus Gunning, which she would appreciate.  Spoke with Dr. Manus Gunning. At minimum would like EKG, bHCG, CBC. These are relatively inexpensive tests. Discussed with pt. She is amenable with this plan.   EKG does show some t-wave inversions though pt had similar findings in 2008. BMP and trop added. PERC score 0.  Labs  unremarkable. CXR negative for acute injury. Pain improved with percocet. Suspect contusion with associated pain-induced vasovagal pre-syncopal episode last night. Rx given for pain meds. ER return precautions given.  I personally performed the services described in this documentation, which was scribed in my presence. The recorded information has been reviewed and is accurate.    Carlene Coria, PA-C 01/13/16 1224  Glynn Octave, MD 01/13/16 [phone]

## 2016-01-13 NOTE — Discharge Instructions (Signed)
You were seen in the ER today for evaluation of pain after a fall. Your x-ray was normal. We also checked labs due to your episode of almost passing out last night. Your bloodwork was unremarkable. You likely had pain-induced pre-syncope (pre-fainting). I will give you several medications to help with your pain.  Take medications as prescribed. Return to the emergency room for worsening condition or new concerning symptoms. Follow up with your regular doctor. If you don't have a regular doctor use one of the numbers below to establish a primary care doctor.   Emergency Department Resource Guide 1) Find a Doctor and Pay Out of Pocket Although you won't have to find out who is covered by your insurance plan, it is a good idea to ask around and get recommendations. You will then need to call the office and see if the doctor you have chosen will accept you as a new patient and what types of options they offer for patients who are self-pay. Some doctors offer discounts or will set up payment plans for their patients who do not have insurance, but you will need to ask so you aren't surprised when you get to your appointment.  2) Contact Your Local Health Department Not all health departments have doctors that can see patients for sick visits, but many do, so it is worth a call to see if yours does. If you don't know where your local health department is, you can check in your phone book. The CDC also has a tool to help you locate your state's health department, and many state websites also have listings of all of their local health departments.  3) Find a Walk-in Clinic If your illness is not likely to be very severe or complicated, you may want to try a walk in clinic. These are popping up all over the country in pharmacies, drugstores, and shopping centers. They're usually staffed by nurse practitioners or physician assistants that have been trained to treat common illnesses and complaints. They're usually  fairly quick and inexpensive. However, if you have serious medical issues or chronic medical problems, these are probably not your best option.  No Primary Care Doctor: - Call Health Connect at  [phone] - they can help you locate a primary care doctor that  accepts your insurance, provides certain services, etc. - Physician Referral [phone]  Emergency Department Resource Guide 1) Find a Doctor and Pay Out of Pocket Although you won't have to find out who is covered by your insurance plan, it is a good idea to ask around and get recommendations. You will then need to call the office and see if the doctor you have chosen will accept you as a new patient and what types of options they offer for patients who are self-pay. Some doctors offer discounts or will set up payment plans for their patients who do not have insurance, but you will need to ask so you aren't surprised when you get to your appointment.  2) Contact Your Local Health Department Not all health departments have doctors that can see patients for sick visits, but many do, so it is worth a call to see if yours does. If you don't know where your local health department is, you can check in your phone book. The CDC also has a tool to help you locate your state's health department, and many state websites also have listings of all of their local health departments.  3) Find a Walk-in Clinic If your illness  is not likely to be very severe or complicated, you may want to try a walk in clinic. These are popping up all over the country in pharmacies, drugstores, and shopping centers. They're usually staffed by nurse practitioners or physician assistants that have been trained to treat common illnesses and complaints. They're usually fairly quick and inexpensive. However, if you have serious medical issues or chronic medical problems, these are probably not your best option.  No Primary Care Doctor: - Call Health Connect at   [phone] - they can help you locate a primary care doctor that  accepts your insurance, provides certain services, etc. - Physician Referral Service- [phone]  Chronic Pain Problems: Organization         Address  Phone   Notes  [facility]  [phone] Patients need to be referred by their primary care doctor.   Medication Assistance: Organization         Address  Phone   Notes  [facility] [address] [place], [place] [address] [phone] --Must be a resident of [place] [facility] [place] [facility] -- Must have NO insurance coverage whatsoever (no Medicaid/ Medicare, etc.) -- The pt. MUST have a primary care doctor that directs their care regularly and follows them in the community   [facility]  [phone]   [employer]  [phone]    Agencies that provide inexpensive medical care: Organization         Address  Phone   Notes  [facility]  [phone]   [facility]    [phone]   [facility] [address] [place], [place] [address] [phone]   [facility] [address], [place] [phone]   [facility]    [phone]   [facility]    [phone]   [facility]  [address], [place] Phone:  [phone], Fax:  [phone] Hours of Operation:  9 am - 6 pm, M-F.  Also accepts Medicaid/Medicare and self-pay.  [facility]  [address], [place] Phone: [phone], Fax: [phone]. Hours of Operation:  8:30 am - 5:30 pm, M-F.  Also accepts Medicaid and self-pay.  [facility] [place] [address], [place] Phone: [phone]) [phone]   [facility] [address], [place], [place] [phone], Ext. [phone] Mondays & Thursdays: 7-9 AM.  First 15 patients are seen on a first come, first serve basis.     Medicaid-accepting [facility] Providers:  Organization         Address  Phone   Notes  [facility] [address], [place] [phone] Also accepts self-pay patients.  [facility] [address], [place]  [phone]) [phone]   [facility] [address], [place] [phone]   [facility] [address] [place] [address], [place] [phone]   Renaye RakersVeita Bland [address], [place]   [phone] Only accepts [place] patients after they have their name applied to their card.   Self-Pay (no insurance) in [facility] [facility] [place]:  Organization         Address  Phone   Notes  Sickle Cell Patients, [facility] [address], [place] [phone]  [facility] [place] [phone]   [facility] [address] [place]  [place], [place] [phone]   [facility]/Dr. Osei-Bonsu  [address], [place] or [address], [place] [phone] Phone number for both [place] and [place] locations is the same.  [facility] [address], [place] [phone]   [facility] [address] [place] or [address] [phone]   [facility] [address] [place] [phone], phone; [phone], fax Sees patients 1st and 3rd Saturday of every month.  Must not qualify for public or private insurance (i.e. Medicaid, Medicare, [place] Health Choice, Veterans' Benefits)  Household income should be no more than 200% of the poverty level The clinic cannot treat you if you are pregnant or think you are pregnant  Sexually transmitted diseases are not treated at the clinic.

## 2017-06-26 ENCOUNTER — Emergency Department (HOSPITAL_COMMUNITY): Payer: PRIVATE HEALTH INSURANCE

## 2017-06-26 ENCOUNTER — Encounter (HOSPITAL_COMMUNITY): Payer: Self-pay

## 2017-06-26 ENCOUNTER — Emergency Department (HOSPITAL_COMMUNITY)
Admission: EM | Admit: 2017-06-26 | Discharge: 2017-06-26 | Disposition: A | Discharge status: Home / Self Care | Payer: PRIVATE HEALTH INSURANCE | Attending: Emergency Medicine | Admitting: Emergency Medicine

## 2017-06-26 DIAGNOSIS — R079 Chest pain, unspecified: Secondary | ICD-10-CM | POA: Diagnosis not present

## 2017-06-26 DIAGNOSIS — Z5321 Procedure and treatment not carried out due to patient leaving prior to being seen by health care provider: Secondary | ICD-10-CM | POA: Insufficient documentation

## 2017-06-26 LAB — CBC
HCT: 43 % (ref 36.0–46.0)
HEMOGLOBIN: 14.5 g/dL (ref 12.0–15.0)
MCH: 30.9 pg (ref 26.0–34.0)
MCHC: 33.7 g/dL (ref 30.0–36.0)
MCV: 91.5 fL (ref 78.0–100.0)
PLATELETS: 307 10*3/uL (ref 150–400)
RBC: 4.7 MIL/uL (ref 3.87–5.11)
RDW: 13.5 % (ref 11.5–15.5)
WBC: 10.1 10*3/uL (ref 4.0–10.5)

## 2017-06-26 LAB — BASIC METABOLIC PANEL
ANION GAP: 10 (ref 5–15)
BUN: 16 mg/dL (ref 6–20)
CO2: 22 mmol/L (ref 22–32)
Calcium: 9.4 mg/dL (ref 8.9–10.3)
Chloride: 107 mmol/L (ref 101–111)
Creatinine, Ser: 0.67 mg/dL (ref 0.44–1.00)
GFR calc non Af Amer: >60 mL/min (ref >60)
Glucose, Bld: 107 mg/dL — ABNORMAL HIGH (ref 65–99)
Potassium: 3.9 mmol/L (ref 3.5–5.1)
SODIUM: 139 mmol/L (ref 135–145)

## 2017-06-26 LAB — I-STAT TROPONIN, ED: TROPONIN I, POC: 0 ng/mL (ref 0.00–0.08)

## 2017-06-26 NOTE — ED Triage Notes (Signed)
Pt presents to the ed with complaints of chest pain that started yesterday radiating into her back.  The patient also complains of numbness in her left arm that went up into her head that started yesterday and has since subsided.  Pt is tearful in triage and states that this feels like her anxiety but she has never had the pain in her arm with it.

## 2017-06-26 NOTE — ED Notes (Signed)
Pt encouraged to remain to be seen, pt declines, pt states, "I don't think I am going to stay."

## 2017-09-01 ENCOUNTER — Ambulatory Visit (INDEPENDENT_AMBULATORY_CARE_PROVIDER_SITE_OTHER): Payer: PRIVATE HEALTH INSURANCE

## 2017-09-01 ENCOUNTER — Encounter (HOSPITAL_COMMUNITY): Payer: Self-pay | Admitting: Emergency Medicine

## 2017-09-01 ENCOUNTER — Ambulatory Visit (HOSPITAL_COMMUNITY)
Admission: EM | Admit: 2017-09-01 | Discharge: 2017-09-01 | Disposition: A | Discharge status: Home / Self Care | Payer: PRIVATE HEALTH INSURANCE | Attending: Emergency Medicine | Admitting: Emergency Medicine

## 2017-09-01 DIAGNOSIS — R059 Cough, unspecified: Secondary | ICD-10-CM

## 2017-09-01 DIAGNOSIS — R05 Cough: Secondary | ICD-10-CM

## 2017-09-01 DIAGNOSIS — R0789 Other chest pain: Secondary | ICD-10-CM

## 2017-09-01 DIAGNOSIS — J0140 Acute pansinusitis, unspecified: Secondary | ICD-10-CM

## 2017-09-01 MED ORDER — BENZONATATE 200 MG PO CAPS: 200.0000 mg | ORAL_CAPSULE | Freq: Three times a day (TID) | ORAL | 0 refills | Status: DC | PRN

## 2017-09-01 MED ORDER — FLUTICASONE PROPIONATE 50 MCG/ACT NA SUSP: 2.0000 | Freq: Every day | NASAL | 0 refills | Status: AC

## 2017-09-01 MED ORDER — DOXYCYCLINE HYCLATE 100 MG PO CAPS: 100.0000 mg | ORAL_CAPSULE | Freq: Two times a day (BID) | ORAL | 0 refills | Status: AC

## 2017-09-01 MED ORDER — AEROCHAMBER PLUS MISC: 2 refills | Status: AC

## 2017-09-01 MED ORDER — HYDROCOD POLST-CPM POLST ER 10-8 MG/5ML PO SUER: 5.0000 mL | Freq: Two times a day (BID) | ORAL | 0 refills | Status: AC | PRN

## 2017-09-01 MED ORDER — IBUPROFEN 600 MG PO TABS: 600.0000 mg | ORAL_TABLET | Freq: Four times a day (QID) | ORAL | 0 refills | Status: AC | PRN

## 2017-09-01 MED ORDER — ALBUTEROL SULFATE HFA 108 (90 BASE) MCG/ACT IN AERS: 1.0000 | INHALATION_SPRAY | Freq: Four times a day (QID) | RESPIRATORY_TRACT | 0 refills | Status: AC | PRN

## 2017-09-01 NOTE — ED Provider Notes (Signed)
HPI  SUBJECTIVE:  Sydney Serrano is a 51 y.o. female who presents with URI symptoms for 3 weeks.  She reports greenish nasal congestion, rhinorrhea, postnasal drip, cough productive of the same material as her nasal congestion.  She reports fevers T-max 100.1, reports sinus pain and pressure, states that her upper teeth hurt.  She reports wheezing, chest and back soreness from the coughing, but denies shortness of breath.  States the cough is worse at night.  She states that she never got better.  She denies hemoptysis, unintentional weight loss.  No antibiotics in the past month.  No antipyretic in the past 6-8 hours.  She has tried multiple over the counter cold medicines without improvement of symptoms.  Her cough is worse at night.  Her sinus pain and pressure is worse with bending forward and lying down.  She is a smoker, 1 pack/day for 15 years.  No history of asthma emphysema, COPD.  She has a past medical history of hypertension, no history of diabetes, cancer.  PMD: Norm SaltVanstory, Ancel Easler N, PA    Past Medical History:  Diagnosis Date  . Anxiety   . Back pain   . Depression   . Essential hypertension 03/14/2011    Past Surgical History:  Procedure Laterality Date  . TUBAL LIGATION      Family History  Problem Relation Age of Onset  . Cancer Maternal Grandmother   . Dementia Mother   . Alcohol abuse Father   . Bipolar disorder Neg Hx   . Depression Neg Hx   . Drug abuse Neg Hx     Social History   Tobacco Use  . Smoking status: Current Every Day Smoker    Packs/day: 0.50    Years: 15.00    Pack years: 7.50    Types: Cigarettes  . Smokeless tobacco: Never Used  Substance Use Topics  . Alcohol use: No  . Drug use: No    No current facility-administered medications for this encounter.   Current Outpatient Medications:  .  albuterol (PROVENTIL HFA;VENTOLIN HFA) 108 (90 Base) MCG/ACT inhaler, Inhale 1-2 puffs every 6 (six) hours as needed into the lungs for wheezing  or shortness of breath., Disp: 1 Inhaler, Rfl: 0 .  benzonatate (TESSALON) 200 MG capsule, Take 1 capsule (200 mg total) 3 (three) times daily as needed by mouth for cough., Disp: 30 capsule, Rfl: 0 .  chlorpheniramine-HYDROcodone (TUSSIONEX PENNKINETIC ER) 10-8 MG/5ML SUER, Take 5 mLs every 12 (twelve) hours as needed by mouth for cough., Disp: 120 mL, Rfl: 0 .  doxycycline (VIBRAMYCIN) 100 MG capsule, Take 1 capsule (100 mg total) 2 (two) times daily for 7 days by mouth., Disp: 14 capsule, Rfl: 0 .  fluticasone (FLONASE) 50 MCG/ACT nasal spray, Place 2 sprays daily into both nostrils., Disp: 16 g, Rfl: 0 .  hydrochlorothiazide (HYDRODIURIL) 25 MG tablet, Take 25 mg by mouth daily., Disp: , Rfl:  .  ibuprofen (ADVIL,MOTRIN) 600 MG tablet, Take 1 tablet (600 mg total) every 6 (six) hours as needed by mouth., Disp: 30 tablet, Rfl: 0 .  lithium carbonate (LITHOBID) 300 MG CR tablet, Take 1 tablet (300 mg total) by mouth daily., Disp: 30 tablet, Rfl: 1 .  Multiple Vitamins-Minerals (MULTIVITAMIN WITH MINERALS) tablet, Take 1 tablet by mouth daily., Disp: , Rfl:  .  QUEtiapine (SEROQUEL) 200 MG tablet, Take 1 tablet (200 mg total) by mouth at bedtime., Disp: 30 tablet, Rfl: 1 .  Spacer/Aero-Holding Chambers (AEROCHAMBER PLUS) inhaler, Use as instructed,  Disp: 1 each, Rfl: 2  No Known Allergies   ROS  As noted in HPI.   Physical Exam  BP (!) 144/88 (BP Location: Right Arm)   Pulse 77   Temp 98.3 F (36.8 C) (Oral)   Resp 18   SpO2 100%   Constitutional: Well developed, well nourished, no acute distress Eyes:  EOMI, conjunctiva normal bilaterally HENT: Normocephalic, atraumatic,mucus membranes moist.  Of mucoid nasal congestion.  Positive frontal and maxillary sinus tenderness.  Normal oropharynx.  Positive postnasal drip. Respiratory: Normal inspiratory effort clear bilaterally, good air movement.  Positive diffuse chest wall tenderness Cardiovascular: Regular rhythm no murmurs rubs or  gallops GI: nondistended skin: No rash, skin intact Musculoskeletal: no deformities Neurologic: Alert & oriented x 3, no focal neuro deficits Psychiatric: Speech and behavior appropriate   ED Course   Medications - No data to display  Orders Placed This Encounter  Procedures  . DG Chest 2 View    Standing Status:   Standing    Number of Occurrences:   1    Order Specific Question:   Reason for Exam (SYMPTOM  OR DIAGNOSIS REQUIRED)    Answer:   cough    Order Specific Question:   Is patient pregnant?    Answer:   Unknown (Please Explain)    No results found for this or any previous visit (from the past 24 hour(s)). Dg Chest 2 View  Result Date: 09/01/2017 CLINICAL DATA:  Pt sts cough x 3 weeks ago was productive (green sputum) has turned into a dry cough, and pain in chest and back with generalized body aches. Pt notes chills at night and nausea, denies temperature. No heart or lung conditions, HTN, smoker x 12 years. EXAM: CHEST  2 VIEW COMPARISON:  06/26/2017 FINDINGS: Cardiac silhouette is normal in size and configuration. Normal mediastinal and hilar contours. On the frontal view only, there is a questionable small nodule in the left mid lung, superimposed over the posterolateral left eighth rib. This was not present previously. Lungs are otherwise clear.  No pleural effusion or pneumothorax. Skeletal structures are unremarkable. IMPRESSION: 1. No acute cardiopulmonary disease. 2. Possible small nodule in the left mid lung. This may reflect superimposed normal structures. Recommend either follow-up shallow oblique radiographs or chest CT. Electronically Signed   By: Amie Portland M.D.   On: 09/01/2017 16:36    ED Clinical Impression  Acute non-recurrent pansinusitis  Cough   ED Assessment/Plan  [place] Narcotic database reviewed for this patient, and feel that the risk/benefit ratio today is favorable for proceeding with a prescription for controlled substance.  She is currently  on Klonopin from a single provider.  No opiate prescriptions since 2017.  Reviewed imaging independently.  Possible small nodule in the left lung.  No acute cardiopulmonary disease.  See radiology report for full details.  Discussed x-ray findings with patient.  Discussed with her that she needs to follow-up with her primary care physician for repeat chest x-ray or CT if the mass persists on repeat x-ray.  She agrees with plan.  Presentation consistent with a sinus infection and a cough most likely from the postnasal drip.  Will send home with doxycycline, Flonase, Mucinex D.  If this raises her blood pressure too much, regular Mucinex.  Saline nasal irrigation, Tessalon, albuterol because she is a smoker and a cough syrup.  Also ibuprofen 600 mg with 1 g of Tylenol.  2-day work note.  Discussed labs, imaging, MDM, plan and followup with patient.  Discussed sn/sx that should prompt return to the ED. patient agrees with plan.   Meds ordered this encounter  Medications  . doxycycline (VIBRAMYCIN) 100 MG capsule    Sig: Take 1 capsule (100 mg total) 2 (two) times daily for 7 days by mouth.    Dispense:  14 capsule    Refill:  0  . fluticasone (FLONASE) 50 MCG/ACT nasal spray    Sig: Place 2 sprays daily into both nostrils.    Dispense:  16 g    Refill:  0  . ibuprofen (ADVIL,MOTRIN) 600 MG tablet    Sig: Take 1 tablet (600 mg total) every 6 (six) hours as needed by mouth.    Dispense:  30 tablet    Refill:  0  . benzonatate (TESSALON) 200 MG capsule    Sig: Take 1 capsule (200 mg total) 3 (three) times daily as needed by mouth for cough.    Dispense:  30 capsule    Refill:  0  . Spacer/Aero-Holding Chambers (AEROCHAMBER PLUS) inhaler    Sig: Use as instructed    Dispense:  1 each    Refill:  2  . albuterol (PROVENTIL HFA;VENTOLIN HFA) 108 (90 Base) MCG/ACT inhaler    Sig: Inhale 1-2 puffs every 6 (six) hours as needed into the lungs for wheezing or shortness of breath.    Dispense:  1  Inhaler    Refill:  0  . chlorpheniramine-HYDROcodone (TUSSIONEX PENNKINETIC ER) 10-8 MG/5ML SUER    Sig: Take 5 mLs every 12 (twelve) hours as needed by mouth for cough.    Dispense:  120 mL    Refill:  0    *This clinic note was created using Scientist, clinical (histocompatibility and immunogenetics)Dragon dictation software. Therefore, there may be occasional mistakes despite careful proofreading.   ?    Domenick GongMortenson, Dewana Ammirati, MD 09/02/17 [phone]

## 2017-09-01 NOTE — ED Triage Notes (Signed)
Pt sts cough and pain with cough x 3 weeks

## 2017-09-01 NOTE — Discharge Instructions (Signed)
Take the medication as written.  You may take 600 mg of motrin with 1 gram of tylenol up to 3-4 times a day as needed for pain. This is an effective combination for pain.   Use a neti pot or the NeilMed sinus rinse as often as you want to to reduce nasal congestion. Follow the directions on the box.  Cough will get better as we take care of the sinus infection.  Start some Mucinex or Mucinex D.  2 puffs from an albuterol inhaler every 4-6 hours as needed for coughing, wheezing.  Make sure you understand how to use the spacer with that.  Go to www.goodrx.com to look up your medications. This will give you a list of where you can find your prescriptions at the most affordable prices. Or you can ask the pharmacist what the cash price is. This is frequently cheaper than going through insurance.

## 2017-09-07 ENCOUNTER — Other Ambulatory Visit: Payer: Self-pay | Admitting: Physician Assistant

## 2017-09-07 DIAGNOSIS — Z1231 Encounter for screening mammogram for malignant neoplasm of breast: Secondary | ICD-10-CM

## 2019-05-10 ENCOUNTER — Encounter (HOSPITAL_COMMUNITY): Payer: Self-pay | Admitting: Emergency Medicine

## 2019-05-10 ENCOUNTER — Emergency Department (HOSPITAL_COMMUNITY): Payer: Managed Care, Other (non HMO)

## 2019-05-10 ENCOUNTER — Emergency Department (HOSPITAL_COMMUNITY)
Admission: EM | Admit: 2019-05-10 | Discharge: 2019-05-10 | Disposition: A | Discharge status: Home / Self Care | Payer: Managed Care, Other (non HMO) | Attending: Emergency Medicine | Admitting: Emergency Medicine

## 2019-05-10 ENCOUNTER — Other Ambulatory Visit: Payer: Self-pay

## 2019-05-10 DIAGNOSIS — Z79899 Other long term (current) drug therapy: Secondary | ICD-10-CM | POA: Diagnosis not present

## 2019-05-10 DIAGNOSIS — F419 Anxiety disorder, unspecified: Secondary | ICD-10-CM | POA: Diagnosis not present

## 2019-05-10 DIAGNOSIS — I10 Essential (primary) hypertension: Secondary | ICD-10-CM | POA: Diagnosis present

## 2019-05-10 DIAGNOSIS — F17210 Nicotine dependence, cigarettes, uncomplicated: Secondary | ICD-10-CM | POA: Diagnosis not present

## 2019-05-10 LAB — CBC WITH DIFFERENTIAL/PLATELET
Abs Immature Granulocytes: 0.04 10*3/uL (ref 0.00–0.07)
Basophils Absolute: 0.1 10*3/uL (ref 0.0–0.1)
Basophils Relative: 1 %
Eosinophils Absolute: 0.1 10*3/uL (ref 0.0–0.5)
Eosinophils Relative: 1 %
HCT: 41.1 % (ref 36.0–46.0)
Hemoglobin: 14.2 g/dL (ref 12.0–15.0)
Immature Granulocytes: 1 %
Lymphocytes Relative: 29 %
Lymphs Abs: 2.2 10*3/uL (ref 0.7–4.0)
MCH: 33.5 pg (ref 26.0–34.0)
MCHC: 34.5 g/dL (ref 30.0–36.0)
MCV: 96.9 fL (ref 80.0–100.0)
Monocytes Absolute: 0.4 10*3/uL (ref 0.1–1.0)
Monocytes Relative: 5 %
Neutro Abs: 5 10*3/uL (ref 1.7–7.7)
Neutrophils Relative %: 63 %
Platelets: 266 10*3/uL (ref 150–400)
RBC: 4.24 MIL/uL (ref 3.87–5.11)
RDW: 13.6 % (ref 11.5–15.5)
WBC: 7.8 10*3/uL (ref 4.0–10.5)
nRBC: 0 % (ref 0.0–0.2)

## 2019-05-10 LAB — BASIC METABOLIC PANEL
Anion gap: 16 — ABNORMAL HIGH (ref 5–15)
BUN: 11 mg/dL (ref 6–20)
CO2: 22 mmol/L (ref 22–32)
Calcium: 10.1 mg/dL (ref 8.9–10.3)
Chloride: 100 mmol/L (ref 98–111)
Creatinine, Ser: 0.8 mg/dL (ref 0.44–1.00)
GFR calc Af Amer: >60 mL/min (ref >60)
GFR calc non Af Amer: >60 mL/min (ref >60)
Glucose, Bld: 100 mg/dL — ABNORMAL HIGH (ref 70–99)
Potassium: 3 mmol/L — ABNORMAL LOW (ref 3.5–5.1)
Sodium: 138 mmol/L (ref 135–145)

## 2019-05-10 MED ORDER — HYDROXYZINE HCL 25 MG PO TABS: 25.0000 mg | ORAL_TABLET | Freq: Four times a day (QID) | ORAL | 0 refills | Status: AC | PRN

## 2019-05-10 MED ORDER — POTASSIUM CHLORIDE CRYS ER 20 MEQ PO TBCR
40.0000 meq | EXTENDED_RELEASE_TABLET | Freq: Once | ORAL | Status: AC
  Administered 2019-05-10: 40 meq via ORAL
  Filled 2019-05-10: qty 2

## 2019-05-10 NOTE — ED Notes (Signed)
Patient Alert and oriented to baseline. Stable and ambulatory to baseline. Patient verbalized understanding of the discharge instructions.  Patient belongings were taken by the patient.   

## 2019-05-10 NOTE — Discharge Instructions (Addendum)
Continue taking your medications as prescribed.  You can take the hydroxyzine every 6 hours as needed for anxiety.  Be aware this may make you drowsy. Continue monitoring your blood pressures daily to bring to your primary care follow-up appointment. Limit your sodium intake in your diet, as this can increase your blood pressures. Return to the ER if you develop chest pain, severe headache, vision changes, or new concerning symptoms.

## 2019-05-10 NOTE — ED Triage Notes (Signed)
Pt. Stated, I went to the [facility] and my BP would not come down. They gave me 3 pills to bring it down. I bought a machine and its still up.

## 2019-05-10 NOTE — ED Provider Notes (Signed)
[facility]   CSN: [id number] Arrival date & time: 05/10/19  1212    History   Chief Complaint Chief Complaint  Patient presents with  . Hypertension    HPI Sydney Serrano is a 53 y.o. female with past medical history of anxiety, hypertension, presenting to the emergency department with complaint of hypertension.  Patient states she is noted her pressure being more elevated over the last few days.  She is followed by primary care she has been on losartan and hydrochlorothiazide for 2 years and has not had any recent dose changes.  She states with her primary care yesterday who noted her to have high blood pressure, however her 3 doses of medication in the office her blood pressure did not come down much.  She states they sent her home due to anxiety and thought she may have some improvement at home.  She states she does endorse increased stress level recently, her mother is currently in a nursing home and not doing as well.  She states she has high anxiety level on this may be affecting it.  She also states she is let her die ago and has been increasing the salt in her diet.  She denies headache, vision changes, chest pain, shortness of breath, leg swelling, or any other new or associated symptoms.     The history is provided by the patient.    Past Medical History:  Diagnosis Date  . Anxiety   . Back pain   . Depression   . Essential hypertension 03/14/2011    Patient Active Problem List   Diagnosis Date Noted  . Nicotine dependence 02/08/2015  . Panic disorder with agoraphobia 02/08/2015  . GAD (generalized anxiety disorder) 02/08/2015  . 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 Depression with anxiety 03/14/2011  . Low back pain 03/14/2011  . Essential hypertension 03/14/2011    Past Surgical History:  Procedure Laterality Date  . TUBAL LIGATION       OB  History   No obstetric history on file.      Home Medications    Prior to Admission medications   Medication Sig Start Date End Date Taking? Authorizing Provider  albuterol (PROVENTIL HFA;VENTOLIN HFA) 108 (90 Base) MCG/ACT inhaler Inhale 1-2 puffs every 6 (six) hours as needed into the lungs for wheezing or shortness of breath. 09/01/17   Domenick GongMortenson, Ashley, MD  benzonatate (TESSALON) 200 MG capsule Take 1 capsule (200 mg total) 3 (three) times daily as needed by mouth for cough. 09/01/17   Domenick GongMortenson, Ashley, MD  chlorpheniramine-HYDROcodone (TUSSIONEX PENNKINETIC ER) 10-8 MG/5ML SUER Take 5 mLs every 12 (twelve) hours as needed by mouth for cough. 09/01/17   Domenick GongMortenson, Ashley, MD  fluticasone (FLONASE) 50 MCG/ACT nasal spray Place 2 sprays daily into both nostrils. 09/01/17   Domenick GongMortenson, Ashley, MD  hydrochlorothiazide (HYDRODIURIL) 25 MG tablet Take 25 mg by mouth daily.    [provider]  hydrOXYzine (ATARAX/VISTARIL) 25 MG tablet Take 1 tablet (25 mg total) by mouth every 6 (six) hours as needed for anxiety. 05/10/19   , [place] N, PA-C  ibuprofen (ADVIL,MOTRIN) 600 MG tablet Take 1 tablet (600 mg total) every 6 (six) hours as needed by mouth. 09/01/17   Domenick GongMortenson, Ashley, MD  lithium carbonate (LITHOBID) 300 MG CR tablet Take 1 tablet (300 mg total) by mouth daily. 02/08/15 02/08/16  Oletta DarterAgarwal, [place], MD  Multiple Vitamins-Minerals (MULTIVITAMIN WITH MINERALS) tablet Take 1  tablet by mouth daily.    [provider]  QUEtiapine (SEROQUEL) 200 MG tablet Take 1 tablet (200 mg total) by mouth at bedtime. 02/08/15   Oletta DarterAgarwal, [place], MD  Spacer/Aero-Holding Chambers (AEROCHAMBER PLUS) inhaler Use as instructed 09/01/17   Domenick GongMortenson, Ashley, MD    Family History Family History  Problem Relation Age of Onset  . Cancer Maternal Grandmother   . Dementia Mother   . Alcohol abuse Father   . Bipolar disorder Neg Hx   . Depression Neg Hx   . Drug abuse Neg Hx     Social History  Social History   Tobacco Use  . Smoking status: Current Every Day Smoker    Packs/day: 0.50    Years: 15.00    Pack years: 7.50    Types: Cigarettes  . Smokeless tobacco: Never Used  Substance Use Topics  . Alcohol use: No  . Drug use: No     Allergies   Patient has no known allergies.   Review of Systems Review of Systems  All other systems reviewed and are negative.    Physical Exam Updated Vital Signs BP (!) 166/91   Pulse 83   Temp 98.1 F (36.7 C) (Oral)   Resp 15   SpO2 100%   Physical Exam Vitals signs and nursing note reviewed.  Constitutional:      General: She is not in acute distress.    Appearance: She is well-developed. She is not ill-appearing.  HENT:     Head: Normocephalic and atraumatic.  Eyes:     Conjunctiva/sclera: Conjunctivae normal.  Neck:     Musculoskeletal: Normal range of motion and neck supple.  Cardiovascular:     Rate and Rhythm: Normal rate and regular rhythm.     Comments: Trace pretibial edema bilaterally.  No redness or tenderness. Pulmonary:     Effort: Pulmonary effort is normal. No respiratory distress.     Breath sounds: Normal breath sounds.  Abdominal:     General: Bowel sounds are normal.     Palpations: Abdomen is soft.     Tenderness: There is no abdominal tenderness. There is no guarding or rebound.  Skin:    General: Skin is warm.  Neurological:     Mental Status: She is alert.     Comments: Mental Status:  Alert, oriented, thought content appropriate, able to give a coherent history. Speech fluent without evidence of aphasia. Able to follow 2 step commands without difficulty.  Cranial Nerves:  II:  Peripheral visual fields grossly normal, pupils equal, round, reactive to light III,IV, VI: ptosis not present, extra-ocular motions intact bilaterally  V,VII: smile symmetric, facial light touch sensation equal VIII: hearing grossly normal to voice  X: uvula elevates symmetrically  XI: bilateral shoulder  shrug symmetric and strong XII: midline tongue extension without fassiculations Motor:  Normal tone. 5/5 in upper and lower extremities bilaterally including strong and equal grip strength and dorsiflexion/plantar flexion Sensory: grossly normal in all extremities.  Cerebellar: normal finger-to-nose with bilateral upper extremities Gait: normal gait and balance CV: distal pulses palpable throughout    Psychiatric:        Attention and Perception: Attention normal.        Mood and Affect: Affect normal. Mood is anxious.        Speech: Speech normal.        Behavior: Behavior normal.      ED Treatments / Results  Labs (all labs ordered are listed, but only abnormal results are  displayed) Labs Reviewed  BASIC METABOLIC PANEL - Abnormal; Notable for the following components:      Result Value   Potassium 3.0 (*)    Glucose, Bld 100 (*)    Anion gap 16 (*)    All other components within normal limits  CBC WITH DIFFERENTIAL/PLATELET    EKG EKG Interpretation  Date/Time:  Tuesday May 10 2019 14:13:44 EDT Ventricular Rate:  83 PR Interval:    QRS Duration: 108 QT Interval:  426 QTC Calculation: 501 R Axis:   58 Text Interpretation:  Sinus rhythm Abnormal R-wave progression, early transition No significant change since last tracing Confirmed by Virgel Manifold [phone]) on 05/10/2019 3:26:08 PM   Radiology Dg Chest 2 View  Result Date: 05/10/2019 CLINICAL DATA:  Hypertension for 2 days EXAM: CHEST - 2 VIEW COMPARISON:  September 01, 2017 FINDINGS: The heart size and mediastinal contours are within normal limits. Both lungs are clear. The visualized skeletal structures are unremarkable. IMPRESSION: No active cardiopulmonary disease. Electronically Signed   By: Abelardo Diesel M.D.   On: 05/10/2019 15:06    Procedures Procedures (including critical care time)  Medications Ordered in ED Medications  potassium chloride SA (K-DUR) CR tablet 40 mEq (has no administration in time  range)     Initial Impression / Assessment and Plan / ED Course  I have reviewed the triage vital signs and the nursing notes.  Pertinent labs & imaging results that were available during my care of the patient were reviewed by me and considered in my medical decision making (see chart for details).        Patient presenting for hypertension, on losartan and HCTZ.    No signs or symptoms of hypertensive urgency.  Likely related to increase sodium in diet and increased stress at home.  Labs are reassuring, mild hypokalemia, replaced orally in the ED.  Recommend some diet modifications, continue monitoring her blood pressure for PCP follow-up.  Will prescribe some Vistaril for her anxiety.  Discussed with patient the need for close follow-up and management by their primary care physician.   Discussed results, findings, treatment and follow up. Patient advised of return precautions. Patient verbalized understanding and agreed with plan.  Final Clinical Impressions(s) / ED Diagnoses   Final diagnoses:  Hypertension, unspecified type  Anxiety    ED Discharge Orders         Ordered    hydrOXYzine (ATARAX/VISTARIL) 25 MG tablet  Every 6 hours PRN     05/10/19 1534           , [place] N, PA-C 05/10/19 1535    Virgel Manifold, MD 05/11/19 1006

## 2019-05-10 NOTE — ED Triage Notes (Signed)
Pt. Stated, I take 2 BP pills already

## 2022-04-11 ENCOUNTER — Encounter: Payer: Self-pay | Admitting: Emergency Medicine

## 2022-04-11 ENCOUNTER — Ambulatory Visit (INDEPENDENT_AMBULATORY_CARE_PROVIDER_SITE_OTHER): Payer: Managed Care, Other (non HMO)

## 2022-04-11 ENCOUNTER — Ambulatory Visit
Admission: EM | Admit: 2022-04-11 | Discharge: 2022-04-11 | Disposition: A | Discharge status: Home / Self Care | Payer: Managed Care, Other (non HMO) | Attending: Internal Medicine | Admitting: Internal Medicine

## 2022-04-11 DIAGNOSIS — R059 Cough, unspecified: Secondary | ICD-10-CM

## 2022-04-11 DIAGNOSIS — J069 Acute upper respiratory infection, unspecified: Secondary | ICD-10-CM | POA: Diagnosis not present

## 2022-04-11 DIAGNOSIS — R053 Chronic cough: Secondary | ICD-10-CM | POA: Diagnosis not present

## 2022-04-11 MED ORDER — BENZONATATE 100 MG PO CAPS: 100.0000 mg | ORAL_CAPSULE | Freq: Three times a day (TID) | ORAL | 0 refills | Status: AC | PRN

## 2022-04-11 MED ORDER — DOXYCYCLINE HYCLATE 100 MG PO CAPS: 100.0000 mg | ORAL_CAPSULE | Freq: Two times a day (BID) | ORAL | 0 refills | Status: AC

## 2022-04-11 MED ORDER — PREDNISONE 20 MG PO TABS: 20.0000 mg | ORAL_TABLET | Freq: Every day | ORAL | 0 refills | Status: AC

## 2022-04-11 NOTE — ED Triage Notes (Signed)
Pt is present today with lower back pain, cough, nasal congestion, and HA.Pt sx started x2 weeks ago

## 2022-04-11 NOTE — Discharge Instructions (Addendum)
You have been prescribed 3 medications to alleviate symptoms.  Please follow-up if symptoms persist or worsen. 

## 2022-04-11 NOTE — ED Provider Notes (Signed)
[facility]    CSN: [id number] Arrival date & time: 04/11/22  1345      History   Chief Complaint Chief Complaint  Patient presents with   Cough   Headache   Nasal Congestion    HPI Sydney Serrano is a 56 y.o. female.   Patient presents with lower back pain, cough, nasal congestion, headache that has been present for about 2 weeks.  Patient is attributing her lower back pain due to excessive and forceful coughing.  Denies any other injury.  Denies any known sick contacts or fever.  Denies chest pain, shortness of breath, sore throat, ear pain, nausea, vomiting, diarrhea, abdominal pain.  Patient has taken Mucinex with little improvement.  Denies history of asthma or COPD but patient does report that she smokes approximately 5 cigarettes/day.   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      Home Medications    Prior to Admission medications   Medication Sig Start Date End Date Taking? Authorizing Provider  benzonatate (TESSALON) 100 MG capsule Take 1 capsule (100 mg total) by mouth every 8 (eight) hours as needed for cough. 04/11/22  Yes , Acie Fredrickson, FNP  doxycycline (VIBRAMYCIN) 100 MG capsule Take 1 capsule (100 mg total) by mouth 2 (two) times daily. 04/11/22  Yes , Rolly Salter E, FNP  predniSONE (DELTASONE) 20 MG tablet Take 1 tablet (20 mg total) by mouth daily for  5 days. 04/11/22 04/16/22 Yes , Acie Fredrickson, FNP  albuterol (PROVENTIL HFA;VENTOLIN HFA) 108 (90 Base) MCG/ACT inhaler Inhale 1-2 puffs every 6 (six) hours as needed into the lungs for wheezing or shortness of breath. 09/01/17   Domenick Gong, MD  chlorpheniramine-HYDROcodone (TUSSIONEX PENNKINETIC ER) 10-8 MG/5ML SUER Take 5 mLs every 12 (twelve) hours as needed by mouth for cough. 09/01/17   Domenick Gong, MD  fluticasone (FLONASE) 50 MCG/ACT nasal spray Place 2 sprays daily into both nostrils. 09/01/17   Domenick Gong, MD  hydrochlorothiazide (HYDRODIURIL) 25 MG tablet Take 25 mg by mouth daily.    [provider]  hydrOXYzine (ATARAX/VISTARIL) 25 MG tablet Take 1 tablet (25 mg total) by mouth every 6 (six) hours as needed for anxiety. 05/10/19   Robinson, [place] N, PA-C  ibuprofen (ADVIL,MOTRIN) 600 MG tablet Take 1 tablet (600 mg total) every 6 (six) hours as needed by mouth. 09/01/17   Domenick Gong, MD  lithium carbonate (LITHOBID) 300 MG CR tablet Take 1 tablet (300 mg total) by mouth daily. 02/08/15 02/08/16  Oletta Darter, MD  Multiple Vitamins-Minerals (MULTIVITAMIN WITH MINERALS) tablet Take 1 tablet by mouth daily.    [provider]  QUEtiapine (SEROQUEL) 200 MG tablet Take 1 tablet (200 mg total) by mouth at bedtime. 02/08/15   Oletta Darter, MD  Spacer/Aero-Holding Chambers (AEROCHAMBER PLUS) inhaler Use as instructed 09/01/17   Domenick Gong, MD  Family History Family History  Problem Relation Age of Onset   Cancer Maternal Grandmother    Dementia Mother    Alcohol abuse Father    Bipolar disorder Neg Hx    Depression Neg Hx    Drug abuse Neg Hx     Social History Social History   Tobacco Use   Smoking status: Every Day    Packs/day: 0.50    Years: 15.00    Total pack years: 7.50    Types: Cigarettes   Smokeless tobacco: Never  Substance Use Topics   Alcohol use: No   Drug use: No     Allergies   Patient has no known  allergies.   Review of Systems Review of Systems Per HPI  Physical Exam Triage Vital Signs ED Triage Vitals [04/11/22 1354]  Enc Vitals Group     BP (!) 162/88     Pulse Rate 85     Resp 18     Temp 97.8 F (36.6 C)     Temp src      SpO2 98 %     Weight      Height      Head Circumference      Peak Flow      Pain Score 9     Pain Loc      Pain Edu?      Excl. in GC?    No data found.  Updated Vital Signs BP (!) 162/88   Pulse 85   Temp 97.8 F (36.6 C)   Resp 18   SpO2 98%   Visual Acuity Right Eye Distance:   Left Eye Distance:   Bilateral Distance:    Right Eye Near:   Left Eye Near:    Bilateral Near:     Physical Exam Constitutional:      General: She is not in acute distress.    Appearance: Normal appearance. She is not toxic-appearing or diaphoretic.  HENT:     Head: Normocephalic and atraumatic.     Right Ear: Tympanic membrane and ear canal normal.     Left Ear: Tympanic membrane and ear canal normal.     Nose: Congestion present.     Mouth/Throat:     Mouth: Mucous membranes are moist.     Pharynx: No posterior oropharyngeal erythema.  Eyes:     Extraocular Movements: Extraocular movements intact.     Conjunctiva/sclera: Conjunctivae normal.     Pupils: Pupils are equal, round, and reactive to light.  Cardiovascular:     Rate and Rhythm: Normal rate and regular rhythm.     Pulses: Normal pulses.     Heart sounds: Normal heart sounds.  Pulmonary:     Effort: Pulmonary effort is normal. No respiratory distress.     Breath sounds: No stridor. Rhonchi present. No wheezing or rales.  Abdominal:     General: Abdomen is flat. Bowel sounds are normal.     Palpations: Abdomen is soft.  Musculoskeletal:        General: Normal range of motion.     Cervical back: Normal range of motion.  Skin:    General: Skin is warm and dry.  Neurological:     General: No focal deficit present.     Mental Status: She is alert and oriented to person,  place, and time. Mental status is at baseline.  Psychiatric:        Mood and Affect: Mood normal.        Behavior:  Behavior normal.      UC Treatments / Results  Labs (all labs ordered are listed, but only abnormal results are displayed) Labs Reviewed - No data to display  EKG   Radiology DG Chest 2 View  Result Date: 04/11/2022 CLINICAL DATA:  Cough EXAM: CHEST - 2 VIEW COMPARISON:  Chest x-ray dated May 10, 2019 FINDINGS: The heart size and mediastinal contours are within normal limits. Both lungs are clear. The visualized skeletal structures are unremarkable. IMPRESSION: No active cardiopulmonary disease. Electronically Signed   By: Allegra Lai M.D.   On: 04/11/2022 14:20    Procedures Procedures (including critical care time)  Medications Ordered in UC Medications - No data to display  Initial Impression / Assessment and Plan / UC Course  I have reviewed the triage vital signs and the nursing notes.  Pertinent labs & imaging results that were available during my care of the patient were reviewed by me and considered in my medical decision making (see chart for details).     Chest x-ray was negative for any acute cardiopulmonary process.  Will treat with doxycycline given patient's history of smoking and to cover for atypicals as well as adventitious lung sounds found on exam.  Patient is not in any respiratory distress and oxygen is normal so no need for emergent evaluation at the hospital at this time.  Prednisone prescribed at low-dose and short course to alleviate inflammation in chest and harsh cough.  Patient does take hydrochlorothiazide but I do think that benefits of prednisone outweigh risks at this time.  Discussed return precautions.  Patient verbalized understanding and was agreeable with plan. Final Clinical Impressions(s) / UC Diagnoses   Final diagnoses:  Acute upper respiratory infection  Persistent cough     Discharge Instructions      You have  been prescribed 3 medications to alleviate symptoms.  Please follow-up if symptoms persist or worsen.    ED Prescriptions     Medication Sig Dispense Auth. Provider   doxycycline (VIBRAMYCIN) 100 MG capsule Take 1 capsule (100 mg total) by mouth 2 (two) times daily. 20 capsule [place], [place] E, [place]   predniSONE (DELTASONE) 20 MG tablet Take 1 tablet (20 mg total) by mouth daily for 5 days. 5 tablet [place], [place] E, [place]   benzonatate (TESSALON) 100 MG capsule Take 1 capsule (100 mg total) by mouth every 8 (eight) hours as needed for cough. 21 capsule [place], Acie Fredrickson, [place]      PDMP not reviewed this encounter.   Gustavus Bryant, [place] 04/11/22 1436

## 2022-05-16 ENCOUNTER — Other Ambulatory Visit: Payer: Self-pay | Admitting: Physician Assistant

## 2022-05-16 DIAGNOSIS — Z1231 Encounter for screening mammogram for malignant neoplasm of breast: Secondary | ICD-10-CM

## 2022-12-07 IMAGING — DX DG CHEST 2V
2 series · 2 of 2 positions shown · non-contrast
Comparison: Chest x-ray dated May 10, 2019

CLINICAL DATA: Cough

EXAM:
CHEST - 2 VIEW

[chest pa]
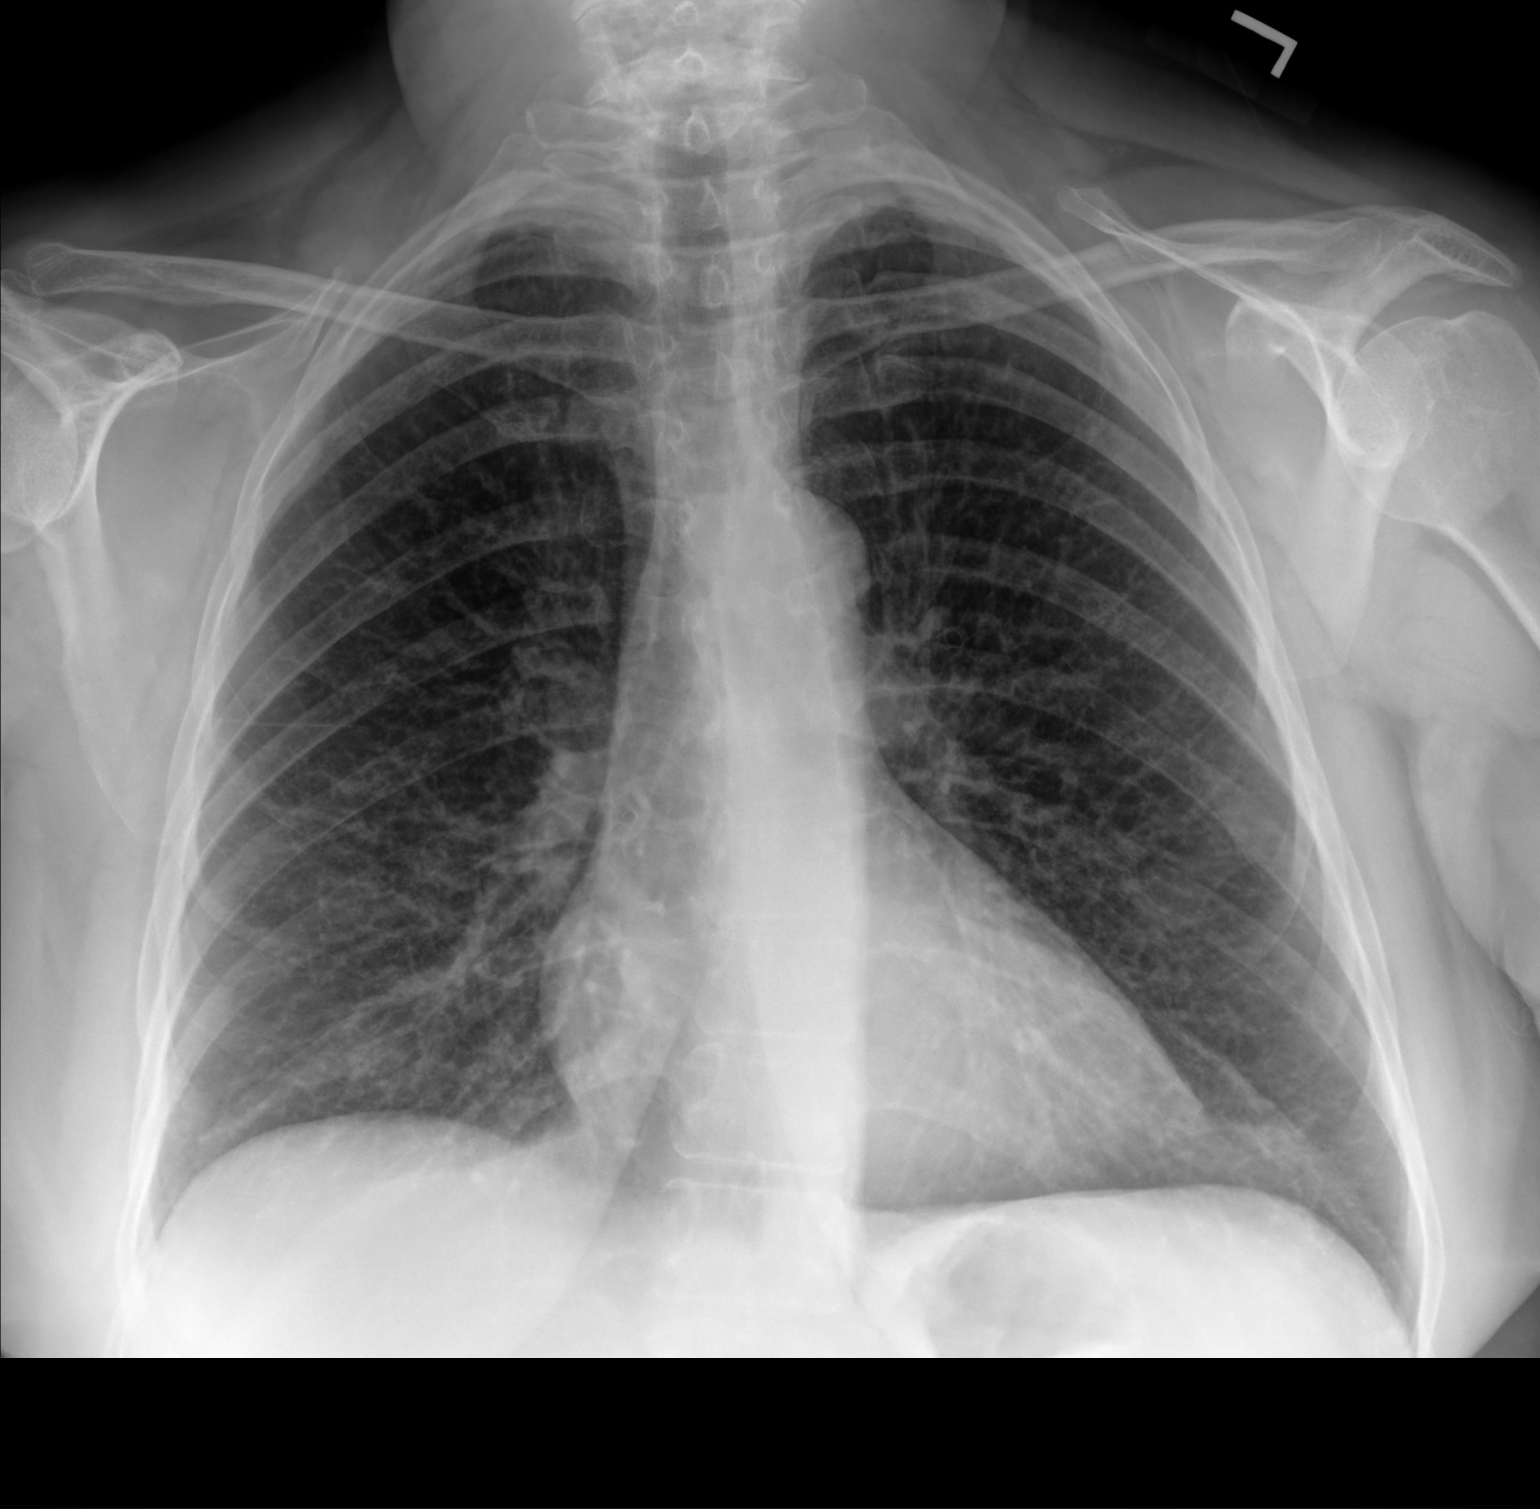

[chest lat]
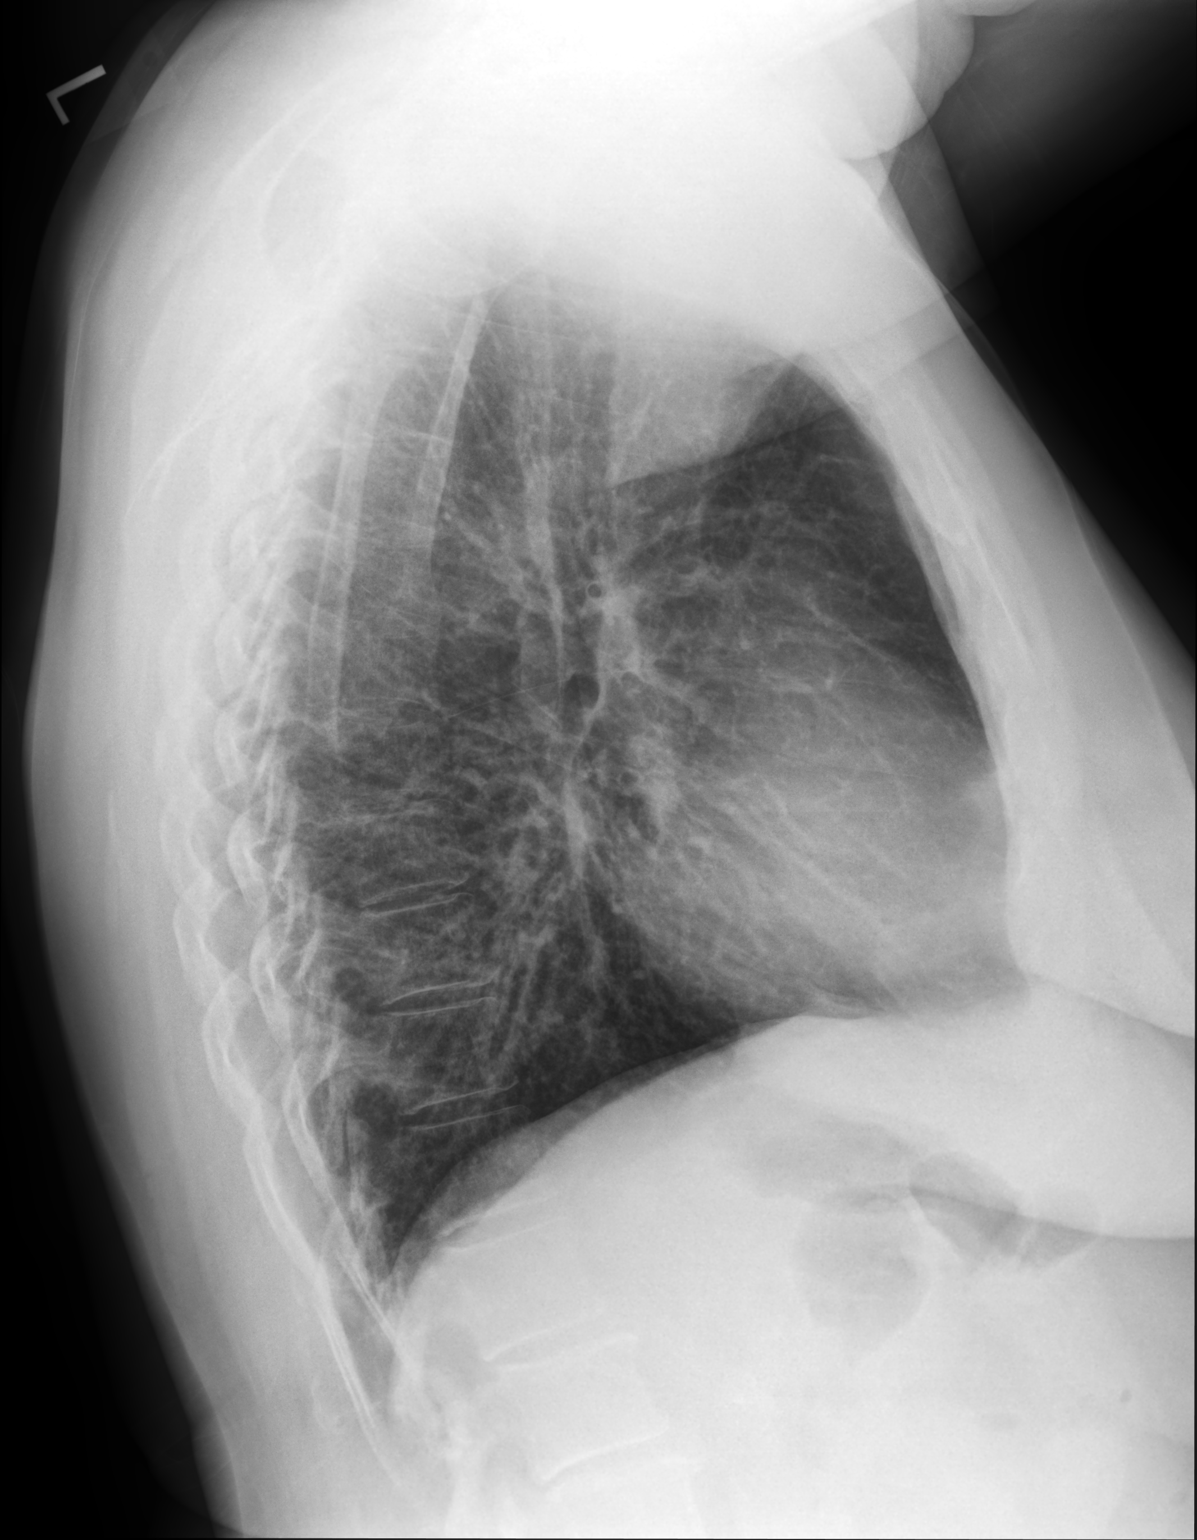

[2 of 2 positions shown; findings below may reference images not displayed]

FINDINGS: The heart size and mediastinal contours are within normal limits.
Both lungs are clear. The visualized skeletal structures are
unremarkable.
IMPRESSION: No active cardiopulmonary disease.

## 2023-06-30 ENCOUNTER — Ambulatory Visit
Admission: RE | Admit: 2023-06-30 | Discharge: 2023-06-30 | Disposition: A | Discharge status: Home / Self Care | Payer: Managed Care, Other (non HMO) | Source: Ambulatory Visit | Attending: Physician Assistant | Admitting: Physician Assistant

## 2023-06-30 ENCOUNTER — Other Ambulatory Visit: Payer: Self-pay | Admitting: Physician Assistant

## 2023-06-30 DIAGNOSIS — Z1231 Encounter for screening mammogram for malignant neoplasm of breast: Secondary | ICD-10-CM

## 2023-09-17 ENCOUNTER — Other Ambulatory Visit: Payer: Self-pay | Admitting: Nephrology

## 2023-09-17 DIAGNOSIS — N179 Acute kidney failure, unspecified: Secondary | ICD-10-CM

## 2023-09-17 DIAGNOSIS — N1831 Chronic kidney disease, stage 3a: Secondary | ICD-10-CM

## 2024-08-15 ENCOUNTER — Other Ambulatory Visit: Payer: Self-pay | Admitting: Physician Assistant

## 2024-08-15 DIAGNOSIS — Z1231 Encounter for screening mammogram for malignant neoplasm of breast: Secondary | ICD-10-CM

## 2024-08-31 ENCOUNTER — Ambulatory Visit
Admission: RE | Admit: 2024-08-31 | Discharge: 2024-08-31 | Disposition: A | Discharge status: Home / Self Care | Source: Ambulatory Visit | Attending: Physician Assistant | Admitting: Physician Assistant

## 2024-08-31 DIAGNOSIS — Z1231 Encounter for screening mammogram for malignant neoplasm of breast: Secondary | ICD-10-CM
